# Patient Record
Sex: Male | Born: 1980 | Race: White | Hispanic: No | Marital: Married | Smoking: Current every day smoker
Health system: Southern US, Community
[De-identification: ages and names within clinical notes are randomized; demographics above are authoritative.]

## PROBLEM LIST (undated history)

## (undated) DIAGNOSIS — M549 Dorsalgia, unspecified: Secondary | ICD-10-CM

## (undated) HISTORY — PX: CYST EXCISION: SHX5701

---

## 2012-11-19 ENCOUNTER — Emergency Department (HOSPITAL_COMMUNITY)
Admission: EM | Admit: 2012-11-19 | Discharge: 2012-11-19 | Disposition: A | Discharge status: Home / Self Care | Payer: Medicaid Other | Attending: Emergency Medicine | Admitting: Emergency Medicine

## 2012-11-19 ENCOUNTER — Encounter (HOSPITAL_COMMUNITY): Payer: Self-pay | Admitting: *Deleted

## 2012-11-19 ENCOUNTER — Emergency Department (HOSPITAL_COMMUNITY): Payer: Medicaid Other

## 2012-11-19 DIAGNOSIS — S92919A Unspecified fracture of unspecified toe(s), initial encounter for closed fracture: Secondary | ICD-10-CM | POA: Insufficient documentation

## 2012-11-19 DIAGNOSIS — F17200 Nicotine dependence, unspecified, uncomplicated: Secondary | ICD-10-CM | POA: Insufficient documentation

## 2012-11-19 DIAGNOSIS — Y9389 Activity, other specified: Secondary | ICD-10-CM | POA: Insufficient documentation

## 2012-11-19 DIAGNOSIS — W208XXA Other cause of strike by thrown, projected or falling object, initial encounter: Secondary | ICD-10-CM | POA: Insufficient documentation

## 2012-11-19 DIAGNOSIS — S92911A Unspecified fracture of right toe(s), initial encounter for closed fracture: Secondary | ICD-10-CM

## 2012-11-19 DIAGNOSIS — Y929 Unspecified place or not applicable: Secondary | ICD-10-CM | POA: Insufficient documentation

## 2012-11-19 MED ORDER — MELOXICAM 7.5 MG PO TABS: ORAL_TABLET | ORAL | Status: DC

## 2012-11-19 MED ORDER — HYDROCODONE-ACETAMINOPHEN 5-325 MG PO TABS: ORAL_TABLET | ORAL | Status: DC

## 2012-11-19 NOTE — ED Provider Notes (Signed)
History     CSN: [id number]  Arrival date & time 11/19/12  4098   First MD Initiated Contact with Patient 11/19/12 0930      Chief Complaint  Patient presents with  . Toe Injury    (Consider location/radiation/quality/duration/timing/severity/associated sxs/prior treatment) HPI Comments: Pt was carrying wood when 3 moderate size pieces fell on the right 4th and 5th toes last evening. Pt has  Pain with applying weight. No hx of injury or surgery to the right foot.  The history is provided by the patient.    History reviewed. No pertinent past medical history.  Past Surgical History  Procedure Date  . Cyst excision     No family history on file.  History  Substance Use Topics  . Smoking status: Current Every Day Smoker    Types: Cigarettes  . Smokeless tobacco: Not on file  . Alcohol Use: Yes     Comment: Occ      Review of Systems  Constitutional: Negative for activity change.       All ROS Neg except as noted in HPI  HENT: Negative for nosebleeds and neck pain.   Eyes: Negative for photophobia and discharge.  Respiratory: Negative for cough, shortness of breath and wheezing.   Cardiovascular: Negative for chest pain and palpitations.  Gastrointestinal: Negative for abdominal pain and blood in stool.  Genitourinary: Negative for dysuria, frequency and hematuria.  Musculoskeletal: Negative for back pain and arthralgias.  Skin: Negative.   Neurological: Negative for dizziness, seizures and speech difficulty.  Psychiatric/Behavioral: Negative for hallucinations and confusion.    Allergies  Review of patient's allergies indicates no known allergies.  Home Medications  No current outpatient prescriptions on file.  BP 121/88  Pulse 84  Temp 97.8 F (36.6 C) (Oral)  Resp 20  Ht 5\' 9"  (1.753 m)  Wt 145 lb (65.772 kg)  BMI 21.41 kg/m2  SpO2 100%  Physical Exam  Nursing note and vitals reviewed. Constitutional: He is oriented to person, place, and time.  He appears well-developed and well-nourished.  Non-toxic appearance.  HENT:  Head: Normocephalic.  Right Ear: Tympanic membrane and external ear normal.  Left Ear: Tympanic membrane and external ear normal.  Eyes: EOM and lids are normal. Pupils are equal, round, and reactive to light.  Neck: Normal range of motion. Neck supple. Carotid bruit is not present.  Cardiovascular: Normal rate, regular rhythm, normal heart sounds, intact distal pulses and normal pulses.   Pulmonary/Chest: Breath sounds normal. No respiratory distress.  Abdominal: Soft. Bowel sounds are normal. There is no tenderness. There is no guarding.  Musculoskeletal: Normal range of motion.       There is swelling and bruising of the right 5th toe from the MP to the distal tip. Pain to palpation. DP 2+. Sensory on the right wnl. No bruise or deformity of the anterior tibial area.  Lymphadenopathy:       Head (right side): No submandibular adenopathy present.       Head (left side): No submandibular adenopathy present.    He has no cervical adenopathy.  Neurological: He is alert and oriented to person, place, and time. He has normal strength. No cranial nerve deficit or sensory deficit.  Skin: Skin is warm and dry.  Psychiatric: He has a normal mood and affect. His speech is normal.    ED Course  Procedures (including critical care time)  Labs Reviewed - No data to display Dg Foot Complete Right  11/19/2012  *RADIOLOGY REPORT*  Clinical Data: Injury to right fifth toe  RIGHT FOOT COMPLETE - 3+ VIEW  Comparison: None.  Findings: There is soft tissue swelling at the metatarsal phalangeal joint.  There is a minimally displaced fracture through the base of the fifth proximal phalanx without significant displacement.  No other focal abnormality is noted.  IMPRESSION: Fracture of the base of the fifth proximal phalanx.   Original Report Authenticated By: Alcide Clever, M.D.      No diagnosis found.    MDM  I have reviewed  nursing notes, vital signs, and all appropriate lab and imaging results for this patient. Xray of the right foot reveals a fx at the base of the right 5th proximal phalanx.  Buddy tape and post op shoe applied. Pt to see Dr Romeo Apple for evaluation. Rx for mobic and norco given to the patient.       Kathie Dike, [place] 11/19/12 1133

## 2012-11-19 NOTE — ED Notes (Signed)
Pt states he dropped a log on his foot at 0400 this morning. States bruising to the toe and pain on same side of foot.

## 2012-11-19 NOTE — ED Provider Notes (Signed)
Medical screening examination/treatment/procedure(s) were performed by non-physician practitioner and as supervising physician I was immediately available for consultation/collaboration.  Donnetta Hutching, MD 11/19/12 1246

## 2012-12-27 ENCOUNTER — Encounter (HOSPITAL_COMMUNITY): Payer: Self-pay

## 2012-12-27 ENCOUNTER — Emergency Department (HOSPITAL_COMMUNITY): Payer: Medicaid Other

## 2012-12-27 ENCOUNTER — Emergency Department (HOSPITAL_COMMUNITY)
Admission: EM | Admit: 2012-12-27 | Discharge: 2012-12-27 | Disposition: A | Discharge status: Home / Self Care | Payer: Medicaid Other | Attending: Emergency Medicine | Admitting: Emergency Medicine

## 2012-12-27 DIAGNOSIS — G8929 Other chronic pain: Secondary | ICD-10-CM | POA: Insufficient documentation

## 2012-12-27 DIAGNOSIS — F17200 Nicotine dependence, unspecified, uncomplicated: Secondary | ICD-10-CM | POA: Insufficient documentation

## 2012-12-27 DIAGNOSIS — M549 Dorsalgia, unspecified: Secondary | ICD-10-CM | POA: Insufficient documentation

## 2012-12-27 HISTORY — DX: Dorsalgia, unspecified: M54.9

## 2012-12-27 MED ORDER — OXYCODONE-ACETAMINOPHEN 5-325 MG PO TABS
2.0000 | ORAL_TABLET | Freq: Once | ORAL | Status: AC
  Administered 2012-12-27: 2 via ORAL
  Filled 2012-12-27: qty 2

## 2012-12-27 MED ORDER — OXYCODONE-ACETAMINOPHEN 5-325 MG PO TABS: 1.0000 | ORAL_TABLET | Freq: Four times a day (QID) | ORAL | Status: DC | PRN

## 2012-12-27 NOTE — ED Notes (Signed)
Back pain with history of the same. Really started bothering me over the past 3 days per pt.

## 2012-12-29 NOTE — ED Provider Notes (Signed)
History     CSN: [id number]  Arrival date & time 12/27/12  7829   First MD Initiated Contact with Patient 12/27/12 0421      Chief Complaint  Patient presents with  . Back Pain    (Consider location/radiation/quality/duration/timing/severity/associated sxs/prior treatment) HPIThomas Ford is a 31 y.o. male presenting with acute on chronic back pain. Denies fever, chills, weight loss, antecedent trauma, weakness, numbness, paresthesia, loss of bowel or bladder continence.  His pain is severe, lumbar spinal region, left lumbar PS muscle region is worse with radiation into the buttock. He has had this pain before. He has recently moved back into the area and is getting set up with pain management.  Past Medical History  Diagnosis Date  . Back pain     Past Surgical History  Procedure Date  . Cyst excision     History reviewed. No pertinent family history.  History  Substance Use Topics  . Smoking status: Current Every Day Smoker    Types: Cigarettes  . Smokeless tobacco: Not on file  . Alcohol Use: Yes     Comment: Occ      Review of Systems At least 10pt or greater review of systems completed and are negative except where specified in the HPI.  Allergies  Review of patient's allergies indicates no known allergies.  Home Medications   Current Outpatient Rx  Name  Route  Sig  Dispense  Refill  . HYDROCODONE-ACETAMINOPHEN 5-325 MG PO TABS      1 or 2 po q4h prn pain   20 tablet   0   . MELOXICAM 7.5 MG PO TABS      1 po bid with food   12 tablet   0   . OXYCODONE-ACETAMINOPHEN 5-325 MG PO TABS   Oral   Take 1-2 tablets by mouth every 6 (six) hours as needed for pain.   13 tablet   0     BP 133/85  Pulse 91  Temp 97.9 F (36.6 C) (Oral)  Ht 5\' 11"  (1.803 m)  Wt 140 lb (63.504 kg)  BMI 19.53 kg/m2  SpO2 100%  Physical Exam  Nursing notes reviewed.  Electronic medical record reviewed. VITAL SIGNS:   Filed Vitals:   12/27/12 0358  BP:  133/85  Pulse: 91  Temp: 97.9 F (36.6 C)  TempSrc: Oral  Height: 5\' 11"  (1.803 m)  Weight: 140 lb (63.504 kg)  SpO2: 100%   CONSTITUTIONAL: Awake, oriented, appears non-toxic HENT: Atraumatic, normocephalic, oral mucosa pink and moist, airway patent. Nares patent without drainage. External ears normal. EYES: Conjunctiva clear, EOMI, PERRLA NECK: Trachea midline, non-tender, supple CARDIOVASCULAR: Normal heart rate, Normal rhythm, No murmurs, rubs, gallops PULMONARY/CHEST: Clear to auscultation, no rhonchi, wheezes, or rales. Symmetrical breath sounds. Non-tender. ABDOMINAL: Non-distended, soft, non-tender - no rebound or guarding.  BS normal. NEUROLOGIC: Non-focal, moving all four extremities, no gross sensory or motor deficits. Reflexes 2+ bilateral patella and Achilles, no clonus. Pt can walk and stand unassisted. BACK: Back pain left lumbar region EXTREMITIES: No clubbing, cyanosis, or edema SKIN: Warm, Dry, No erythema, No rash  ED Course  Procedures (including critical care time)  Labs Reviewed - No data to display No results found. Dg Lumbar Spine Complete  12/27/2012  *RADIOLOGY REPORT*  Clinical Data: Lower back pain for 3 weeks.  LUMBAR SPINE - COMPLETE 4+ VIEW  Comparison: None.  Findings: There is no evidence of fracture or subluxation. Vertebral bodies demonstrate normal height and alignment. Intervertebral disc spaces  are preserved.  The visualized neural foramina are grossly unremarkable in appearance.  The visualized bowel gas pattern is unremarkable in appearance; air and stool are noted within the colon.  The sacroiliac joints are within normal limits.  IMPRESSION: No evidence of fracture or subluxation along the lumbar spine.   Original Report Authenticated By: Tonia Ghent, M.D.     1. Chronic back pain       MDM  Jorge Ford is a 31 y.o. male presents with acute on chronic back pain.  XR neg.  Treated pain and DC witthr Rx for pain medicine after review  of narcotic database.  I explained the diagnosis and have given explicit precautions to return to the ER including numbness, tingling, weakness, urinary retention or incontinence or any other new or worsening symptoms. The patient understands and accepts the medical plan as it's been dictated and I have answered their questions. Discharge instructions concerning home care and prescriptions have been given.  The patient is STABLE and is discharged to home in good condition.         Jones Skene, MD 12/29/12 2022

## 2013-01-15 ENCOUNTER — Encounter (HOSPITAL_COMMUNITY): Payer: Self-pay | Admitting: Emergency Medicine

## 2013-01-15 ENCOUNTER — Emergency Department (HOSPITAL_COMMUNITY)
Admission: EM | Admit: 2013-01-15 | Discharge: 2013-01-15 | Disposition: A | Discharge status: Home / Self Care | Payer: Medicaid Other | Attending: Emergency Medicine | Admitting: Emergency Medicine

## 2013-01-15 DIAGNOSIS — M546 Pain in thoracic spine: Secondary | ICD-10-CM | POA: Insufficient documentation

## 2013-01-15 DIAGNOSIS — F17200 Nicotine dependence, unspecified, uncomplicated: Secondary | ICD-10-CM | POA: Insufficient documentation

## 2013-01-15 DIAGNOSIS — M549 Dorsalgia, unspecified: Secondary | ICD-10-CM

## 2013-01-15 MED ORDER — MELOXICAM 7.5 MG PO TABS: 15.0000 mg | ORAL_TABLET | Freq: Every day | ORAL | Status: DC

## 2013-01-15 MED ORDER — OXYCODONE-ACETAMINOPHEN 5-325 MG PO TABS: 1.0000 | ORAL_TABLET | Freq: Four times a day (QID) | ORAL | Status: DC | PRN

## 2013-01-15 MED ORDER — OXYCODONE-ACETAMINOPHEN 5-325 MG PO TABS
2.0000 | ORAL_TABLET | Freq: Once | ORAL | Status: AC
  Administered 2013-01-15: 2 via ORAL
  Filled 2013-01-15: qty 2

## 2013-01-15 NOTE — ED Notes (Signed)
Patient states he has a pinched nerve in his lower back; states pain has been getting worse over the past 3 weeks.  States has an appointment with pain clinic on Tuesday.

## 2013-01-15 NOTE — ED Provider Notes (Signed)
History     CSN: [id number]  Arrival date & time 01/15/13  0229   First MD Initiated Contact with Patient 01/15/13 [phone]      Chief Complaint  Patient presents with  . Back Pain    (Consider location/radiation/quality/duration/timing/severity/associated sxs/prior treatment) HPI Comments: 32 year old male with a history of back pain for which she has been treated at the pain clinic in the past however he has not been in this area for greater than one year until recently and has reestablished with the family doctor who has referred him again to the pain clinic. He has an appointment on Tuesday. Currently he is having back pain which is located in the lower thoracic area, central, nonradiating, aching and severe from time to time. It is worse with movement, worse when he lays down, worse when he changes position. He denies any numbness or weakness to the legs, fevers chills nausea vomiting IV drug use, urinary retention or incontinence and no history of cancer. He denies any other red flags for pathologic back pain. He states this pain is similar to his chronic pain. He has been using aspirin for pain with minimal relief. He did visit the emergency department approximately one month ago and was treated with oxycodone and Mobic with some relief.  Patient is a 32 y.o. male presenting with back pain. The history is provided by the patient.  Back Pain  Pertinent negatives include no fever, no numbness and no weakness.    Past Medical History  Diagnosis Date  . Back pain     Past Surgical History  Procedure Date  . Cyst excision     No family history on file.  History  Substance Use Topics  . Smoking status: Current Every Day Smoker    Types: Cigarettes  . Smokeless tobacco: Not on file  . Alcohol Use: Yes     Comment: Occ      Review of Systems  Constitutional: Negative for fever and chills.  HENT: Negative for neck pain.   Cardiovascular: Negative for leg swelling.    Gastrointestinal: Negative for nausea and vomiting.       No incontinence of bowel  Genitourinary: Negative for difficulty urinating.       No incontinence or retention  Musculoskeletal: Positive for back pain.  Skin: Negative for rash.  Neurological: Negative for weakness and numbness.    Allergies  Review of patient's allergies indicates no known allergies.  Home Medications   Current Outpatient Rx  Name  Route  Sig  Dispense  Refill  . HYDROCODONE-ACETAMINOPHEN 5-325 MG PO TABS      1 or 2 po q4h prn pain   20 tablet   0   . MELOXICAM 7.5 MG PO TABS      1 po bid with food   12 tablet   0   . MELOXICAM 7.5 MG PO TABS   Oral   Take 2 tablets (15 mg total) by mouth daily.   30 tablet   0   . OXYCODONE-ACETAMINOPHEN 5-325 MG PO TABS   Oral   Take 1-2 tablets by mouth every 6 (six) hours as needed for pain.   15 tablet   0     BP 124/84  Pulse 79  Temp 98.6 F (37 C) (Oral)  Resp 20  Ht 5\' 11"  (1.803 m)  Wt 135 lb (61.236 kg)  BMI 18.83 kg/m2  SpO2 99%  Physical Exam  Nursing note and vitals reviewed. Constitutional: He appears  well-developed and well-nourished.  HENT:  Head: Normocephalic and atraumatic.  Eyes: Conjunctivae normal are normal. No scleral icterus.  Cardiovascular: Normal rate, regular rhythm and intact distal pulses.   Pulmonary/Chest: Effort normal and breath sounds normal.  Abdominal: Soft.       No pulsating masses, no guarding, no tenderness  Musculoskeletal: He exhibits tenderness ( Local tenderness to the lower thoracic spine and paraspinal muscles, no tenderness over the lumbar or sacral spines).       No spinal tenderness of the cervical, thoracic or lumbar spines  Neurological: He is alert.       Gait is antalgic secondary to low back pain, isolated strength of the bilateral lower extremities is normal, sensation normal, speech normal. Normal reflexes at the patellar tendons bilaterally  Skin: Skin is warm and dry. No  erythema.    ED Course  Procedures (including critical care time)  Labs Reviewed - No data to display No results found.   1. Back pain       MDM  Overall the patient appears well, there is no acute distress, he is able to give me a full history and has no pathologic sources of back pain on history and I do not detect any focal neurologic deficits and he will be given pain medication in the emergency department and referred as an outpatient to the chronic pain clinic which she has an appointment at in 5 days.        Vida Roller, MD 01/15/13 [phone]

## 2013-01-15 NOTE — ED Notes (Signed)
Discharge instructions given and reviewed with patient.  Prescriptions given for Mobic and Percocet; effects and use explained for each.  Patient verbalized understanding of sedating effects of Percocet and to take Mobic as directed.  My Chart was explained to patient for access to medical record.  Patient verbalized understanding to follow up with Dr. Olena Leatherwood for continued pain management until he can get to scheduled appointment with pain clinic.  Patient ambulatory; discharged home in good condition.  Significant other accompanied discharge to drive home.

## 2013-03-16 ENCOUNTER — Emergency Department (HOSPITAL_COMMUNITY)
Admission: EM | Admit: 2013-03-16 | Discharge: 2013-03-17 | Disposition: A | Discharge status: Home / Self Care | Payer: Medicaid Other | Attending: Emergency Medicine | Admitting: Emergency Medicine

## 2013-03-16 ENCOUNTER — Encounter (HOSPITAL_COMMUNITY): Payer: Self-pay | Admitting: *Deleted

## 2013-03-16 DIAGNOSIS — M546 Pain in thoracic spine: Secondary | ICD-10-CM | POA: Insufficient documentation

## 2013-03-16 DIAGNOSIS — F17200 Nicotine dependence, unspecified, uncomplicated: Secondary | ICD-10-CM | POA: Insufficient documentation

## 2013-03-16 DIAGNOSIS — M549 Dorsalgia, unspecified: Secondary | ICD-10-CM

## 2013-03-16 DIAGNOSIS — G8929 Other chronic pain: Secondary | ICD-10-CM | POA: Insufficient documentation

## 2013-03-16 MED ORDER — OXYCODONE-ACETAMINOPHEN 5-325 MG PO TABS
2.0000 | ORAL_TABLET | Freq: Once | ORAL | Status: AC
  Administered 2013-03-16: 2 via ORAL
  Filled 2013-03-16: qty 2

## 2013-03-16 MED ORDER — CYCLOBENZAPRINE HCL 10 MG PO TABS: 10.0000 mg | ORAL_TABLET | Freq: Three times a day (TID) | ORAL | Status: DC | PRN

## 2013-03-16 MED ORDER — CYCLOBENZAPRINE HCL 10 MG PO TABS
10.0000 mg | ORAL_TABLET | Freq: Once | ORAL | Status: AC
  Administered 2013-03-16: 10 mg via ORAL
  Filled 2013-03-16: qty 1

## 2013-03-16 NOTE — ED Provider Notes (Signed)
Medical screening examination/treatment/procedure(s) were performed by non-physician practitioner and as supervising physician I was immediately available for consultation/collaboration.   Shelda Jakes, MD 03/16/13 [phone]

## 2013-03-16 NOTE — ED Provider Notes (Signed)
History     CSN: [id number]  Arrival date & time 03/16/13  2208   First MD Initiated Contact with Patient 03/16/13 2231      Chief Complaint  Patient presents with  . Back Pain    (Consider location/radiation/quality/duration/timing/severity/associated sxs/prior treatment) HPI Comments: Patient with hx of chronic lower thoracic back pain c/o worsening pain for 3 weeks.  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  He states that he is currently being treated at a pain management clinic and has an appt on Friday 03/20/13.  He take hydrocodone 5/325 mg 4 times a day w/o relief of the pain.  Pain is worse with twisting and bending and improves with rest.  He denies numbness or weakness of the extremities, abd pain, dysuria, incontinence or bowel or bladder, chest pain, dyspnea or neck pain.    Patient is a 32 y.o. male presenting with back pain. The history is provided by the patient.  Back Pain Location:  Thoracic spine Quality:  Aching Radiates to:  Does not radiate Pain severity:  Moderate Onset quality:  Gradual Timing:  Intermittent Progression:  Waxing and waning Chronicity:  Chronic Context: lifting heavy objects and twisting   Context: not recent illness   Relieved by:  Bed rest Worsened by:  Movement, twisting and bending Ineffective treatments:  Narcotics Associated symptoms: no abdominal pain, no abdominal swelling, no bladder incontinence, no bowel incontinence, no dysuria, no fever, no headaches, no leg pain, no numbness, no paresthesias, no pelvic pain, no perianal numbness, no tingling and no weakness     Past Medical History  Diagnosis Date  . Back pain     Past Surgical History  Procedure Laterality Date  . Cyst excision      History reviewed. No pertinent family history.  History  Substance Use Topics  . Smoking status: Current Every Day Smoker    Types: Cigarettes  . Smokeless  tobacco: Not on file  . Alcohol Use: Yes     Comment: Occ      Review of Systems  Constitutional: Negative for fever.  Gastrointestinal: Negative for abdominal pain and bowel incontinence.  Genitourinary: Negative for bladder incontinence, dysuria and pelvic pain.  Musculoskeletal: Positive for back pain.  Neurological: Negative for tingling, weakness, numbness, headaches and paresthesias.  All other systems reviewed and are negative.    Allergies  Review of patient's allergies indicates no known allergies.  Home Medications   Current Outpatient Rx  Name  Route  Sig  Dispense  Refill  . HYDROcodone-acetaminophen (NORCO/VICODIN) 5-325 MG per tablet   Oral   Take 1 tablet by mouth every 4 (four) hours as needed for pain. 1 or 2 po q4h prn pain           BP 133/71  Pulse 88  Temp(Src) 98.2 F (36.8 C) (Oral)  Resp 18  Ht 5\' 11"  (1.803 m)  Wt 145 lb (65.772 kg)  BMI 20.23 kg/m2  SpO2 100%  Physical Exam  Nursing note and vitals reviewed. Constitutional: He is oriented to person, place, and time. He appears well-developed and well-nourished. No distress.  HENT:  Head: Normocephalic and atraumatic.  Neck: Normal range of motion. Neck supple.  Cardiovascular: Normal rate, regular rhythm, normal heart sounds and intact distal pulses.   No murmur heard. Pulmonary/Chest: Effort normal and breath sounds normal. No respiratory distress. He exhibits no tenderness.  Abdominal: Soft. He exhibits no distension.  There is no tenderness.  Musculoskeletal: He exhibits tenderness. He exhibits no edema.       Lumbar back: He exhibits tenderness and pain. He exhibits normal range of motion, no swelling, no deformity, no laceration and normal pulse.       Back:  ttp of the lower thoracic spine and paraspinal muscles.  No discoloration, no neuro deficits.  Distal sensation intact  Neurological: He is alert and oriented to person, place, and time. No cranial nerve deficit or sensory  deficit. He exhibits normal muscle tone. Coordination and gait normal.  Reflex Scores:      Tricep reflexes are 2+ on the right side and 2+ on the left side.      Bicep reflexes are 2+ on the right side and 2+ on the left side.      Patellar reflexes are 2+ on the right side and 2+ on the left side.      Achilles reflexes are 2+ on the right side and 2+ on the left side. Skin: Skin is warm and dry.    ED Course  Procedures (including critical care time)  Labs Reviewed - No data to display No results found.      MDM  Previous ED charts reviewed by me.    Vitals are stable, pt is well appearing.    Patient has ttp of the lower thoracic spine and paraspinal muscles.  Hx of same.  No focal neuro deficits on exam.  Has full ROM of both upper and lower extremities.  SLR is negative bilaterally.  Ambulates with a steady gait.   Doubt emergent neurological or infectious process.   He has appt with his pain management clinic this week.  I will give flexeril and percocet here with prescription for flexeril only. Will also write a work restriction for him with the change in his usual work duties being the likely cause of his increased pain.     The patient appears reasonably screened and/or stabilized for discharge and I doubt any other medical condition or other [facility] requiring further screening, evaluation, or treatment in the ED at this time prior to discharge.      Emberlynn Riggan L. Trisha Mangle, PA-C 03/16/13 2327

## 2013-03-16 NOTE — ED Notes (Signed)
Chronic back pain, worse for 3 weeks.

## 2013-03-19 ENCOUNTER — Ambulatory Visit (HOSPITAL_COMMUNITY)
Admission: RE | Admit: 2013-03-19 | Discharge: 2013-03-19 | Disposition: A | Discharge status: Home / Self Care | Payer: Medicaid Other | Source: Ambulatory Visit | Attending: Anesthesiology | Admitting: Anesthesiology

## 2013-03-19 DIAGNOSIS — M545 Low back pain, unspecified: Secondary | ICD-10-CM | POA: Insufficient documentation

## 2013-03-19 DIAGNOSIS — M6281 Muscle weakness (generalized): Secondary | ICD-10-CM | POA: Insufficient documentation

## 2013-03-19 DIAGNOSIS — IMO0001 Reserved for inherently not codable concepts without codable children: Secondary | ICD-10-CM | POA: Insufficient documentation

## 2013-03-19 NOTE — Evaluation (Signed)
Physical Therapy Evaluation/Medicaid  Patient Details  Name: Jorge Ford MRN: [id number] Date of Birth: 1981-11-13  Today's Date: 03/19/2013 Time: 5956-3875 PT Time Calculation (min): 41 min Charges: 1 eval              Visit#: 1 of 4  Re-eval: 04/18/13 Assessment Diagnosis: LBP Next MD Visit: Dr. Nilsa Nutting - tomorrow  Authorization: MEDICAID    Authorization Time Period:    Authorization Visit#: 1 of 4   Past Medical History:  Past Medical History  Diagnosis Date  . Back pain    Past Surgical History:  Past Surgical History  Procedure Laterality Date  . Cyst excision     Subjective Symptoms/Limitations Symptoms: PMH: unremarkable. Pertinent History: Pt is referred to PT for LBP which he is unsure when it started.  He currently is working for [employer] and requires a lot of grinding and lifting.  Has been working for 6 months, 10 hour shifts. second shift food prep at [employer], Denies hx of LBP  Limitations: Lifting How long can you sit comfortably?: couple hours How long can you stand comfortably?: after a few hours starts to hurt (noticed at job as food prep) How long can you walk comfortably?: 8 hours.  Patient Stated Goals: Pt wishes to feel better.  Pain Assessment Currently in Pain?: Yes Pain Score:   5 Pain Location: Back Pain Orientation: Mid;Lower Pain Type: Acute pain;Chronic pain Pain Onset: Other (comment) Pain Frequency: Constant Pain Relieving Factors: hydrocodone, ice and heat Effect of Pain on Daily Activities: difficulty with his job requirments (lifting), pain with transitional movements.   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   Cognition/Observation Observation/Other Assessments Observations: impaired lifting mechanics, bends knees, back bent and keeps object far from body  Sensation/Coordination/Flexibility/Functional Tests Coordination Gross Motor Movements are Fluid and Coordinated: Yes Coordination and Movement Description: independent coordination of transverse abdonimus, pelvic floor and multifidus with weakness with lt multifidus noted   Assessment RLE Assessment RLE Assessment: Within Functional Limits LLE Assessment LLE Assessment: Within Functional Limits Lumbar AROM Lumbar Flexion: WNL - pain and gower sign on return Lumbar Extension: decreased 50% Lumbar - Right Side Bend: WNL Lumbar - Left Side Bend: WNL Palpation Palpation: pin point tenderness to L1 spinous process with mild pain and tenderness to lumbar and thoracic erector spinae.  Mobility/Balance  Ambulation/Gait Ambulation/Gait: Yes Gait Pattern: Antalgic;Lateral hip instability;Lateral trunk lean to left;Lateral trunk lean to right Posture/Postural Control Posture/Postural Control: Postural limitations Postural Limitations: slouched   Exercise/Treatments Supine Ab Set: 5 reps;Limitations AB Set Limitations: 10 sec holds Prone  Other Prone Lumbar Exercises: Pelvic Floor 5x10 sec holds Other Prone Lumbar Exercises: multifidus 5x10 sec holds  Physical Therapy Assessment and Plan PT Assessment and Plan Clinical Impression Statement: Pt is a 32 year old male referred to PT for LBP with following impairments listed below.  At this time he is independent with core coordination movements and demonstrates greatest weakness with lt multiifidus activation and pin point tenderness to L1 spinous process.  Reviewed x-rays from 12/27/12 without report of significant injuiry.  Pt will benefit from skilled therapeutic intervention in order to improve on the following deficits: Pain;Decreased  strength;Improper  body mechanics Rehab Potential: Fair Clinical Impairments Affecting Rehab Potential: secondary to insurance limitations PT Frequency: Min 1X/week PT Duration:  (3) PT Treatment/Interventions: Therapeutic exercise;Therapeutic activities;Neuromuscular re-education;Patient/family education;Manual techniques;Modalities PT Plan: Core strengthening (bent knee raise, clams, SLR), continue to progress towards pilates techniques, pt to be independent with approrpriate lifiting techniques.     Goals Home Exercise Program Pt will Perform Home Exercise Program: Independently PT Goal: Perform Home Exercise Program - Progress: Goal set today PT Short Term Goals Time to Complete Short Term Goals: 3 weeks (3 visits) PT Short Term Goal 1: Pt will report pain less than a 4/10 for 75% of his day.  PT Short Term Goal 2: Pt will demonstrate normal back AROM without pain and end range.  PT Short Term Goal 3: Pt will improve core strength in order to go from transitional movements with reports of decreased pain and without demonstration of gower sign.  PT Short Term Goal 4: Pt will be educated in a proper lifting program in order to decrease risk of secondary impairments.   Problem List Patient Active Problem List  Diagnosis  . Low back pain   PT Plan of Care PT Home Exercise Plan: see scanne report (core exercises) PT Patient Instructions: education, demonstration and independent verbalization of approprriate posture.  Consulted and Agree with Plan of Care: Patient  Annett Fabian, PT 03/19/2013, 2:43 PM  INITIAL EVALUATION  Physical Therapy     Patient Name: Jorge Ford Date Of Birth: 1981/08/29  Guardian Name: N/A Treatment ICD-9 Code: 7242  Address: [address] Date of Evaluation: 03/19/2013  [place], [place] [address] Requested Dates of Service: 03/20/2013 - 04/10/2013       Therapy History: No known therapy for this problem  Reason For Referral: Recipient has an exacerbation of an  old injury, disease or condition  Prior Level of Function: Independent/Modified Independent with all ADLs (OT/PT) or Audition, Communication, Voice and/or Swallowing Skills (ST/AUD)  Additional Medical History: Pertinent History: Pt is referred to PT for LBP which he is unsure when it started. He currently is working for [employer] and requires a lot of grinding and lifting. Has been working for 6 months, 10 hour shifts. 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 Patient Stated Goals: Pt wishes to feel better. Pain Assessment Currently in Pain?: Yes   Prematurity: N/A  Severity Level: N/A       Treatment Goals:  1. Goal: Physical Therapy Evaluation/Medicaid Patient Details Name: Jorge Ford MRN: [id number] Date of Birth: September 07, 1981 Today's Date: 03/19/2013 Time: 8413-2440 PT Time Calculation (min): 41 min Charges: 1 eval Visit#: 1 of 4 Re-eval: 04/18/13 Assessment Diagnosis: LBP Next MD Visit: Dr. Nilsa Nutting - tomorrow Authorization: MEDICAID Authorization Time Period: Authorization Visit#: 1 of 4 Past Medical History: Past Medical History Diagnosis Date . Back pain Past Surgical History: Past Surgical History Procedure Laterality Date . Cyst excision Subjective Symptoms/Limitations Symptoms: PMH: unremarkable. Pertinent History: Pt is referred to PT for LBP which he is unsure when it started. He currently is working for [employer] and requires a lot of grindin  Baseline: given  Duration: 3 Week(s)  2. Goal: Pt will report pain less than a 4/10 for 75% of his day.  Baseline: Pain Score: 5 Pain Location: Back  Pain Orientation: Mid;Lower Pain Type: Acute pain;Chronic pain Pain Onset: Other (comment) Pain Frequency: Constant Pain Relieving Factors: hydrocodone, ice and  heat Effect of Pain on Daily Activities: difficulty with his job requirments (lifting), pain with transitional movements.  Duration: 3 Week(s)  3. Goal: Pt will demonstrate normal back AROM without pain and end range.  Baseline: RLE Assessment RLE Assessment: Within Functional Limits LLE Assessment LLE Assessment: Within Functional Limits Lumbar AROM Lumbar Flexion: WNL - pain and gower sign on return Lumbar Extension: decreased 50% Lumbar - Right Side Bend: WNL Lumbar - Left Side Bend: WNL  Duration: 3 Week(s)  4. Goal: Pt will improve core strength in order to go from transitional movements with reports of decreased pain and without demonstration of gower sign.  Baseline: Lumbar Flexion: WNL - pain and gower sign on return Coordination and Movement Description: independent coordination of transverse abdonimus, pelvic floor and multifidus with weakness with lt multifidus noted  Duration: 3 Week(s)  Goal: Pt will be educated in a proper lifting program in order to decrease risk of secondary impairments.  Baseline: impaired body mechanics. ] Observations: impaired lifting mechanics, bends knees, back bent and keeps object far from body  Duration: 3 Week(s)         Treatment Frequency/Duration:  1x/week for 3 weeks  Units per visit: N/A    Additional Information: Clinical Impression Statement: Pt is a 32 year old male referred to PT for LBP with following impairments listed below. At this time he is independent with core coordination movements and demonstrates greatest weakness with lt multiifidus activation and pin point tenderness to L1 spinous process. Reviewed x-rays from 12/27/12 without report of significant injuiry. Pt will benefit from skilled therapeutic intervention in order to improve on the following deficits: Pain;Decreased strength;Improper body mechanics Rehab Potential: Fair Clinical Impairments Affecting Rehab Potential: secondary to insurance limitations PT Frequency: Min 1X/week PT  Duration: (3) PT Treatment/Interventions: Therapeutic exercise;Therapeutic activities;Neuromuscular re-education;Patient/family education;Manual techniques;Modalities PT Plan: Core strengthening (bent knee raise, clams, SLR), continue to progress towards pilates techniques, pt to be independent with approrpriate lifiting techniques           Therapist Signature  Date Physician Signature  Date    Annett Fabian       Therapist Name  Physician Name   Refer to the Review Status page for current case status

## 2013-03-26 ENCOUNTER — Telehealth (HOSPITAL_COMMUNITY): Payer: Self-pay

## 2013-03-26 ENCOUNTER — Ambulatory Visit (HOSPITAL_COMMUNITY): Payer: Medicaid Other | Admitting: Physical Therapy

## 2013-04-02 ENCOUNTER — Ambulatory Visit (HOSPITAL_COMMUNITY)
Admission: RE | Admit: 2013-04-02 | Discharge: 2013-04-02 | Disposition: A | Discharge status: Home / Self Care | Payer: Medicaid Other | Source: Ambulatory Visit | Attending: Anesthesiology | Admitting: Anesthesiology

## 2013-04-02 DIAGNOSIS — M545 Low back pain, unspecified: Secondary | ICD-10-CM | POA: Insufficient documentation

## 2013-04-02 DIAGNOSIS — M6281 Muscle weakness (generalized): Secondary | ICD-10-CM | POA: Insufficient documentation

## 2013-04-02 DIAGNOSIS — IMO0001 Reserved for inherently not codable concepts without codable children: Secondary | ICD-10-CM | POA: Insufficient documentation

## 2013-04-02 NOTE — Progress Notes (Signed)
Physical Therapy Treatment Patient Details  Name: Jorge Ford MRN: [id number] Date of Birth: 1981-08-04  Today's Date: 04/02/2013 Time: 0932-1002 PT Time Calculation (min): 30 min Charges: 18' TE Visit#: 2 of 4  Re-eval: 04/18/13   Authorization: MEDICAID  Authorization Time Period: 3 visits approved 3/25-4/15  Authorization Visit#: 2 of 4   Subjective: Symptoms/Limitations Pertinent History: Pt reports that he is working on his posture and on the exercises.  Continues to have pain.  Pain Assessment Currently in Pain?: Yes Pain Score:   5 Pain Location: Back  Exercise/Treatments Standing Scapular Retraction: Both;10 reps;Theraband Theraband Level (Scapular Retraction): Level 4 (Blue) Row: Both;10 reps;Theraband Theraband Level (Row): Level 4 (Blue) Shoulder Extension: Both;10 reps;Theraband Theraband Level (Shoulder Extension): Level 4 (Blue) Shoulder ADduction: Both;10 reps;Theraband Theraband Level (Shoulder Adduction): Level 4 (Blue) Supine Clam: 10 reps;Limitations Clam Limitations: BLE w/ab set Bent Knee Raise: 10 reps;Limitations Bent Knee Raise Limitations: BLE w/ab set Other Supine Lumbar Exercises: Pilates 100's Other Supine Lumbar Exercises: Reverse crunch x10; Obliques x10 Prone  Single Arm Raise: Right;Left;5 reps Straight Leg Raise: 5 reps (BLE) Other Prone Lumbar Exercises: Pelvic Floor 8x10 sec holds Other Prone Lumbar Exercises: Back extension with lat pull down 2x5  Physical Therapy Assessment and Plan PT Assessment and Plan Clinical Impression Statement: Pt has improved coordination to core muscles, continues to have decreased strength and inability to hold 10 sec independently and requires cueing to hold.  Added advanced core activities for pt to continue at home.  Pt will benefit from skilled therapeutic intervention in order to improve on the following deficits: Pain;Decreased strength;Improper body mechanics Rehab Potential: Fair Clinical  Impairments Affecting Rehab Potential: secondary to insurance limitations PT Frequency: Min 1X/week PT Duration:  (3) PT Treatment/Interventions: Therapeutic exercise;Therapeutic activities;Neuromuscular re-education;Patient/family education;Manual techniques;Modalities PT Plan: Continue with pilates activities and core strengthening.  Focus on proper lifiting techniques next visit.     Goals    Problem List Patient Active Problem List  Diagnosis  . Low back pain    PT Plan of Care PT Home Exercise Plan: provided with blue t-band and advanced HEP Consulted and Agree with Plan of Care: Patient  Cally Nygard,PT 04/02/2013, 10:04 AM

## 2013-04-09 ENCOUNTER — Inpatient Hospital Stay (HOSPITAL_COMMUNITY): Admission: RE | Admit: 2013-04-09 | Payer: Medicaid Other | Source: Ambulatory Visit

## 2013-04-16 ENCOUNTER — Inpatient Hospital Stay (HOSPITAL_COMMUNITY): Admission: RE | Admit: 2013-04-16 | Payer: Medicaid Other | Source: Ambulatory Visit | Admitting: Physical Therapy

## 2013-10-16 ENCOUNTER — Encounter (HOSPITAL_COMMUNITY): Payer: Self-pay | Admitting: Emergency Medicine

## 2013-10-16 ENCOUNTER — Emergency Department (HOSPITAL_COMMUNITY)
Admission: EM | Admit: 2013-10-16 | Discharge: 2013-10-16 | Disposition: A | Discharge status: Home / Self Care | Payer: Medicaid Other | Attending: Emergency Medicine | Admitting: Emergency Medicine

## 2013-10-16 DIAGNOSIS — Y9389 Activity, other specified: Secondary | ICD-10-CM | POA: Insufficient documentation

## 2013-10-16 DIAGNOSIS — Z87891 Personal history of nicotine dependence: Secondary | ICD-10-CM | POA: Insufficient documentation

## 2013-10-16 DIAGNOSIS — X503XXA Overexertion from repetitive movements, initial encounter: Secondary | ICD-10-CM | POA: Insufficient documentation

## 2013-10-16 DIAGNOSIS — M545 Low back pain: Secondary | ICD-10-CM

## 2013-10-16 DIAGNOSIS — IMO0002 Reserved for concepts with insufficient information to code with codable children: Secondary | ICD-10-CM | POA: Insufficient documentation

## 2013-10-16 DIAGNOSIS — Y929 Unspecified place or not applicable: Secondary | ICD-10-CM | POA: Insufficient documentation

## 2013-10-16 MED ORDER — DICLOFENAC SODIUM 75 MG PO TBEC: 75.0000 mg | DELAYED_RELEASE_TABLET | Freq: Two times a day (BID) | ORAL | Status: DC

## 2013-10-16 MED ORDER — CYCLOBENZAPRINE HCL 10 MG PO TABS: 10.0000 mg | ORAL_TABLET | Freq: Three times a day (TID) | ORAL | Status: DC | PRN

## 2013-10-16 NOTE — ED Provider Notes (Signed)
CSN: [id number]     Arrival date & time 10/16/13  1011 History   First MD Initiated Contact with Patient 10/16/13 1015     Chief Complaint  Patient presents with  . Back Pain   (Consider location/radiation/quality/duration/timing/severity/associated sxs/prior Treatment) Patient is a 32 y.o. male presenting with back pain. The history is provided by the patient.  Back Pain Location:  Lumbar spine Quality:  Aching Radiates to:  Does not radiate Pain severity:  Moderate Pain is:  Same all the time Onset quality:  Gradual Duration:  3 days Timing:  Constant Progression:  Worsening Chronicity:  Recurrent Context: lifting heavy objects and twisting   Context comment:  Patient reports onset of lower back pain after "working in the yard"/  Has hx of same. Relieved by:  Nothing Worsened by:  Bending, movement, standing, twisting and sitting Ineffective treatments:  OTC medications Associated symptoms: no abdominal pain, no abdominal swelling, no bladder incontinence, no bowel incontinence, no chest pain, no dysuria, no fever, no headaches, no leg pain, no numbness, no paresthesias, no pelvic pain, no perianal numbness, no tingling and no weakness     Past Medical History  Diagnosis Date  . Back pain    Past Surgical History  Procedure Laterality Date  . Cyst excision     No family history on file. History  Substance Use Topics  . Smoking status: Former Smoker    Types: Cigarettes  . Smokeless tobacco: Not on file  . Alcohol Use: Yes     Comment: Occ    Review of Systems  Constitutional: Negative for fever.  Respiratory: Negative for shortness of breath.   Cardiovascular: Negative for chest pain.  Gastrointestinal: Negative for vomiting, abdominal pain, constipation and bowel incontinence.  Genitourinary: Negative for bladder incontinence, dysuria, hematuria, flank pain, decreased urine volume, difficulty urinating and pelvic pain.       No perineal numbness or incontinence  of urine or feces  Musculoskeletal: Positive for back pain. Negative for joint swelling.  Skin: Negative for rash.  Neurological: Negative for tingling, weakness, numbness, headaches and paresthesias.  All other systems reviewed and are negative.    Allergies  Review of patient's allergies indicates no known allergies.  Home Medications   Current Outpatient Rx  Name  Route  Sig  Dispense  Refill  . acetaminophen (TYLENOL) 500 MG tablet   Oral   Take 500 mg by mouth every 6 (six) hours as needed for pain.         Marland Kitchen ibuprofen (ADVIL,MOTRIN) 200 MG tablet   Oral   Take 200 mg by mouth every 6 (six) hours as needed for pain.          BP 125/81  Pulse 81  Temp(Src) 97.9 F (36.6 C) (Oral)  Resp 20  Ht 6' (1.829 m)  Wt 145 lb (65.772 kg)  BMI 19.66 kg/m2  SpO2 97% Physical Exam  Nursing note and vitals reviewed. Constitutional: He is oriented to person, place, and time. He appears well-developed and well-nourished. No distress.  HENT:  Head: Normocephalic and atraumatic.  Neck: Normal range of motion. Neck supple.  Cardiovascular: Normal rate, regular rhythm, normal heart sounds and intact distal pulses.   No murmur heard. Pulmonary/Chest: Effort normal and breath sounds normal. No respiratory distress.  Abdominal: Soft. He exhibits no distension. There is no tenderness.  Musculoskeletal: He exhibits tenderness. He exhibits no edema.       Lumbar back: He exhibits tenderness and pain. He exhibits normal range  of motion, no swelling, no deformity, no laceration and normal pulse.       Back:  Diffuse ttp of the lumbar spine and paraspinal muscles.    DP pulses are brisk and symmetrical.  Distal sensation intact.  Hip Flexors/Extensors are intact  Neurological: He is alert and oriented to person, place, and time. He has normal strength. No sensory deficit. He exhibits normal muscle tone. Coordination and gait normal.  Reflex Scores:      Patellar reflexes are 2+ on the  right side and 2+ on the left side.      Achilles reflexes are 2+ on the right side and 2+ on the left side. Skin: Skin is warm and dry. No rash noted.    ED Course  Procedures (including critical care time) Labs Review Labs Reviewed - No data to display Imaging Review No results found.  EKG Interpretation   None       MDM    Patient reviewed on the  narcotic database.  Received #30 hydrocodone 10/325mg  on 10/12/13 but only reported taking tylenol and motrin to pharmacy tech.    No focal neuro deficits on exam.  Ambulates with a steady gait.   No concerning sx's for emergent neurological or infectious process.  I have explained to pt that narcotic pain medication is not indicated at this time given this is a reoccurring problem.  Pt has appt with a PMD next week.    Pt appears stable for discharge.  Meredith Kilbride L. Trisha Mangle, PA-C 10/17/13 1744

## 2013-10-16 NOTE — ED Notes (Signed)
Lower back pain x 3 days, onset after doing yard work. Hx of "back problems" x 6 years.

## 2013-10-16 NOTE — ED Notes (Signed)
Lower back pain x 3 days after doing yard work. Hx of same. Was in pain management clinic, but [place] of [place] and has not been reestablished in clinic. Pain 9/10. No incontinence, numbness or radiation down legs.

## 2013-10-19 NOTE — ED Provider Notes (Signed)
Medical screening examination/treatment/procedure(s) were performed by non-physician practitioner and as supervising physician I was immediately available for consultation/collaboration.   Joya Gaskins, MD 10/19/13 2224

## 2014-05-14 ENCOUNTER — Emergency Department (HOSPITAL_COMMUNITY): Payer: Medicaid Other

## 2014-05-14 ENCOUNTER — Emergency Department (HOSPITAL_COMMUNITY)
Admission: EM | Admit: 2014-05-14 | Discharge: 2014-05-14 | Disposition: A | Discharge status: Home / Self Care | Payer: Medicaid Other | Attending: Emergency Medicine | Admitting: Emergency Medicine

## 2014-05-14 ENCOUNTER — Encounter (HOSPITAL_COMMUNITY): Payer: Self-pay | Admitting: Emergency Medicine

## 2014-05-14 DIAGNOSIS — Y929 Unspecified place or not applicable: Secondary | ICD-10-CM | POA: Insufficient documentation

## 2014-05-14 DIAGNOSIS — W19XXXA Unspecified fall, initial encounter: Secondary | ICD-10-CM

## 2014-05-14 DIAGNOSIS — Y9389 Activity, other specified: Secondary | ICD-10-CM | POA: Insufficient documentation

## 2014-05-14 DIAGNOSIS — Z79899 Other long term (current) drug therapy: Secondary | ICD-10-CM | POA: Insufficient documentation

## 2014-05-14 DIAGNOSIS — W11XXXA Fall on and from ladder, initial encounter: Secondary | ICD-10-CM | POA: Insufficient documentation

## 2014-05-14 DIAGNOSIS — IMO0002 Reserved for concepts with insufficient information to code with codable children: Secondary | ICD-10-CM | POA: Insufficient documentation

## 2014-05-14 DIAGNOSIS — S3992XA Unspecified injury of lower back, initial encounter: Secondary | ICD-10-CM

## 2014-05-14 DIAGNOSIS — F17200 Nicotine dependence, unspecified, uncomplicated: Secondary | ICD-10-CM | POA: Insufficient documentation

## 2014-05-14 LAB — URINALYSIS, ROUTINE W REFLEX MICROSCOPIC
Bilirubin Urine: NEGATIVE
GLUCOSE, UA: NEGATIVE mg/dL
HGB URINE DIPSTICK: NEGATIVE
KETONES UR: NEGATIVE mg/dL
Leukocytes, UA: NEGATIVE
Nitrite: NEGATIVE
PROTEIN: NEGATIVE mg/dL
Specific Gravity, Urine: >1.03 — ABNORMAL HIGH (ref 1.005–1.030)
Urobilinogen, UA: 1 mg/dL (ref 0.0–1.0)
pH: 5.5 (ref 5.0–8.0)

## 2014-05-14 MED ORDER — IBUPROFEN 800 MG PO TABS: 800.0000 mg | ORAL_TABLET | Freq: Three times a day (TID) | ORAL | Status: DC

## 2014-05-14 MED ORDER — IBUPROFEN 800 MG PO TABS
800.0000 mg | ORAL_TABLET | Freq: Once | ORAL | Status: AC
  Administered 2014-05-14: 800 mg via ORAL
  Filled 2014-05-14: qty 1

## 2014-05-14 MED ORDER — HYDROCODONE-ACETAMINOPHEN 5-325 MG PO TABS: 2.0000 | ORAL_TABLET | ORAL | Status: DC | PRN

## 2014-05-14 MED ORDER — HYDROCODONE-ACETAMINOPHEN 5-325 MG PO TABS
2.0000 | ORAL_TABLET | Freq: Once | ORAL | Status: AC
  Administered 2014-05-14: 2 via ORAL
  Filled 2014-05-14: qty 2

## 2014-05-14 NOTE — ED Notes (Signed)
Pt reports fell approx 7-8 ft from ladder this morning, hit head, denies LOC.  C/o pain to mid back.  Denies neck pain .

## 2014-05-14 NOTE — ED Provider Notes (Signed)
CSN: [id number]     Arrival date & time 05/14/14  1600 History   First MD Initiated Contact with Patient 05/14/14 1651    This chart was scribed for Glynn Octave, MD by Marica Otter, ED Scribe. This patient was seen in room [facility]10/[facility]10 and the patient's care was started at 4:57 PM.  Chief Complaint  Patient presents with  . Fall   PCP: Toma Deiters, MD  The history is provided by the patient. No language interpreter was used.   HPI Comments: Jorge Ford is a 33 y.o. male, with a Hx of back pain and cyst excision, who presents to the Emergency Department complaining of a 7-8 ft fall from a ladder this morning at approximately 9:30AM. Pt denies LOC or hitting his head. Pt states that he was able to get up and walk on his own after the fall. Pt complains of associated mid back pain. Pt reports he took over the counter pain meds such as Aleve with minor relief. Pt denies chest pain, HA, dysuria, neck pain, leg pain, loss of bladder control, loss of bowel movements, SOB, testicular pain or abd pain. Pt denies any allergies to meds; and reports that he is currently on Fluoxetine and Olanzapine. Pt is a current everyday smoker.   Past Medical History  Diagnosis Date  . Back pain    Past Surgical History  Procedure Laterality Date  . Cyst excision     No family history on file. History  Substance Use Topics  . Smoking status: Current Every Day Smoker    Types: Cigarettes  . Smokeless tobacco: Not on file  . Alcohol Use: Yes     Comment: Occ    Review of Systems  Respiratory: Negative for shortness of breath.   Cardiovascular: Negative for chest pain.  Gastrointestinal: Negative for abdominal pain.  Genitourinary: Negative for dysuria, decreased urine volume and testicular pain.  Musculoskeletal: Positive for back pain. Negative for neck pain.  Neurological: Negative for headaches.   A complete 10 system review of systems was obtained and all systems are negative except as  noted in the HPI and PMH.   Allergies  Review of patient's allergies indicates no known allergies.  Home Medications   Prior to Admission medications   Medication Sig Start Date End Date Taking? Authorizing Provider  acetaminophen (TYLENOL) 500 MG tablet Take 500 mg by mouth every 6 (six) hours as needed.   Yes Historical Provider, MD  FLUoxetine (PROZAC) 20 MG capsule Take 20 mg by mouth daily.   Yes Historical Provider, MD  OLANZapine (ZYPREXA) 5 MG tablet Take 5 mg by mouth every morning.   Yes Historical Provider, MD   Triage Vitals: BP 118/70  Pulse 105  Temp(Src) 98.2 F (36.8 C) (Oral)  Resp 20  Ht 5\' 11"  (1.803 m)  Wt 155 lb (70.308 kg)  BMI 21.63 kg/m2  SpO2 98%  Physical Exam  Nursing note and vitals reviewed. Constitutional: He is oriented to person, place, and time. He appears well-developed and well-nourished. No distress.  HENT:  Head: Normocephalic and atraumatic.  Mouth/Throat: Oropharynx is clear and moist. No oropharyngeal exudate.  Eyes: Conjunctivae and EOM are normal. Pupils are equal, round, and reactive to light.  Neck: Normal range of motion. Neck supple. No tracheal deviation present.  No C spine tenderness  Cardiovascular: Normal rate.   Pulmonary/Chest: Effort normal. No respiratory distress.  Abdominal: Soft. Bowel sounds are normal. There is no tenderness.  Musculoskeletal: Normal range of motion. He exhibits  tenderness (lower thoracic upper lumbar).  5/5 strength in bilateral lower extremities. Ankle plantar and dorsiflexion intact. Great toe extension intact bilaterally. +2 DP and PT pulses. +2 patellar reflexes bilaterally. Normal gait.  Neurological: He is alert and oriented to person, place, and time. No cranial nerve deficit. He exhibits normal muscle tone. Coordination normal.  Skin: Skin is warm and dry.  Psychiatric: He has a normal mood and affect. His behavior is normal.   ED Course  Procedures (including critical care  time) DIAGNOSTIC STUDIES: Oxygen Saturation is 98% on RA, normal by my interpretation.    COORDINATION OF CARE: 5:02 PM-Discussed treatment plan which includes imaging, UA and meds with pt at bedside and pt agreed to plan.   Labs Review Labs Reviewed  URINALYSIS, ROUTINE W REFLEX MICROSCOPIC - Abnormal; Notable for the following:    Specific Gravity, Urine >1.030 (*)    All other components within normal limits    Imaging Review Dg Chest 2 View  05/14/2014   CLINICAL DATA:  Fall from ladder.  Mid thoracic spine pain.  EXAM: CHEST  2 VIEW  COMPARISON:  None.  FINDINGS: Normal cardiac and mediastinal contours. No consolidative pulmonary opacities no pleural effusion or pneumothorax. Regional skeleton is unremarkable.  IMPRESSION: No acute cardiopulmonary process.   Electronically Signed   By: Annia Beltrew  Davis M.D.   On: 05/14/2014 17:42   Dg Thoracic Spine 2 View  05/14/2014   CLINICAL DATA:  Patient status post fall from ladder. Mid thoracic spine pain.  EXAM: THORACIC SPINE - 2 VIEW  COMPARISON:  DG CHEST 2 VIEW dated 05/14/2014; DG THORACIC SPINE W/ SWIMMERS 3V dated 05/13/2014  FINDINGS: Normal anatomic alignment of the thoracic spine. Preservation of the vertebral body and intervertebral disc space heights. No evidence for static listhesis. No evidence for acute thoracic spine fracture.  IMPRESSION: No evidence for acute thoracic spine fracture.   Electronically Signed   By: Annia Beltrew  Davis M.D.   On: 05/14/2014 17:43   Dg Lumbar Spine Complete  05/14/2014   CLINICAL DATA:  Status post fall from ladder. Mid thoracic spine back pain.  EXAM: LUMBAR SPINE - COMPLETE 4+ VIEW  COMPARISON:  None.  FINDINGS: Five non-rib-bearing lumbar type vertebral bodies are demonstrated in normal anatomic alignment. Preservation of the vertebral body and intervertebral disc space heights. SI joints are unremarkable. Bowel gas pattern is unremarkable.  IMPRESSION: No evidence for acute lumbar spine fracture.    Electronically Signed   By: Annia Beltrew  Davis M.D.   On: 05/14/2014 17:45   Ct Head Wo Contrast  05/14/2014   CLINICAL DATA:  Pain post trauma  EXAM: CT HEAD WITHOUT CONTRAST  TECHNIQUE: Contiguous axial images were obtained from the base of the skull through the vertex without intravenous contrast.  COMPARISON:  None.  FINDINGS: The ventricles are normal in size and configuration. There is no mass, hemorrhage, extra-axial fluid collection, or midline shift. The gray-white compartments are normal.  The bony calvarium appears intact. The mastoid air cells are clear. There is a larger tension cyst in the inferior right maxillary antrum. There is extensive ethmoid sinus disease bilaterally. There is patchy sphenoid sinus disease bilaterally. There is mild mucosal thickening in both maxillary antra.  IMPRESSION: Multifocal paranasal sinus disease.  Study otherwise unremarkable.   Electronically Signed   By: Bretta BangWilliam  Woodruff M.D.   On: 05/14/2014 18:03     EKG Interpretation None      MDM   Final diagnoses:  Fall  Back injury  Fall from about 8 feet off a ladder this morning onto his lower back and middle back. He denies any loss of consciousness. Denies any chest pain or shortness of breath. Denies any head or neck pain. Denies abdominal pain. No focal weakness, numbness or tingling.  C-spine nontender. Breath sounds equal bilaterally. Tenderness to lower thoracic upper lumbar without step-off or deformity. Neurologically intact.  X-rays negative for acute abnormality. Chest x-ray negative without pneumothorax. No hematuria on UA. Patient is ambulatory. He is informed that he'll be sore for the next several days. We'll treat with anti-inflammatories. Followup with PCP. Return precautions discussed. HR improved to 61.  BP 119/82  Pulse 61  Temp(Src) 98.2 F (36.8 C) (Oral)  Resp 16  Ht 5\' 11"  (1.803 m)  Wt 155 lb (70.308 kg)  BMI 21.63 kg/m2  SpO2 100%  I personally performed the services  described in this documentation, which was scribed in my presence. The recorded information has been reviewed and is accurate.    Glynn OctaveStephen Jamion Carter, MD 05/14/14 [phone]

## 2014-05-14 NOTE — Discharge Instructions (Signed)
Contusion Take the pain medication as prescribed. Follow up with your doctor. Return to the ED if you develop new or worsening symptoms A contusion is a deep bruise. Contusions are the result of an injury that caused bleeding under the skin. The contusion may turn blue, purple, or yellow. Minor injuries will give you a painless contusion, but more severe contusions may stay painful and swollen for a few weeks.  CAUSES  A contusion is usually caused by a blow, trauma, or direct force to an area of the body. SYMPTOMS   Swelling and redness of the injured area.  Bruising of the injured area.  Tenderness and soreness of the injured area.  Pain. DIAGNOSIS  The diagnosis can be made by taking a history and physical exam. An X-ray, CT scan, or MRI may be needed to determine if there were any associated injuries, such as fractures. TREATMENT  Specific treatment will depend on what area of the body was injured. In general, the best treatment for a contusion is resting, icing, elevating, and applying cold compresses to the injured area. Over-the-counter medicines may also be recommended for pain control. Ask your caregiver what the best treatment is for your contusion. HOME CARE INSTRUCTIONS   Put ice on the injured area.  Put ice in a plastic bag.  Place a towel between your skin and the bag.  Leave the ice on for 15-20 minutes, 03-04 times a day.  Only take over-the-counter or prescription medicines for pain, discomfort, or fever as directed by your caregiver. Your caregiver may recommend avoiding anti-inflammatory medicines (aspirin, ibuprofen, and naproxen) for 48 hours because these medicines may increase bruising.  Rest the injured area.  If possible, elevate the injured area to reduce swelling. SEEK IMMEDIATE MEDICAL CARE IF:   You have increased bruising or swelling.  You have pain that is getting worse.  Your swelling or pain is not relieved with medicines. MAKE SURE YOU:    Understand these instructions.  Will watch your condition.  Will get help right away if you are not doing well or get worse. Document Released: 09/26/2005 Document Revised: 03/10/2012 Document Reviewed: 10/22/2011 [facility] Patient Information 2014 [place], [place].

## 2016-03-30 ENCOUNTER — Encounter (HOSPITAL_COMMUNITY): Payer: Self-pay | Admitting: Emergency Medicine

## 2016-03-30 ENCOUNTER — Emergency Department (HOSPITAL_COMMUNITY): Payer: Medicaid Other

## 2016-03-30 ENCOUNTER — Emergency Department (HOSPITAL_COMMUNITY)
Admission: EM | Admit: 2016-03-30 | Discharge: 2016-03-30 | Disposition: A | Discharge status: Home / Self Care | Payer: Medicaid Other | Attending: Emergency Medicine | Admitting: Emergency Medicine

## 2016-03-30 DIAGNOSIS — Y929 Unspecified place or not applicable: Secondary | ICD-10-CM | POA: Insufficient documentation

## 2016-03-30 DIAGNOSIS — S161XXA Strain of muscle, fascia and tendon at neck level, initial encounter: Secondary | ICD-10-CM | POA: Insufficient documentation

## 2016-03-30 DIAGNOSIS — W11XXXA Fall on and from ladder, initial encounter: Secondary | ICD-10-CM | POA: Insufficient documentation

## 2016-03-30 DIAGNOSIS — Y939 Activity, unspecified: Secondary | ICD-10-CM | POA: Insufficient documentation

## 2016-03-30 DIAGNOSIS — S199XXA Unspecified injury of neck, initial encounter: Secondary | ICD-10-CM | POA: Diagnosis present

## 2016-03-30 DIAGNOSIS — Y999 Unspecified external cause status: Secondary | ICD-10-CM | POA: Insufficient documentation

## 2016-03-30 DIAGNOSIS — W19XXXA Unspecified fall, initial encounter: Secondary | ICD-10-CM

## 2016-03-30 DIAGNOSIS — F17210 Nicotine dependence, cigarettes, uncomplicated: Secondary | ICD-10-CM | POA: Diagnosis not present

## 2016-03-30 MED ORDER — CYCLOBENZAPRINE HCL 10 MG PO TABS
10.0000 mg | ORAL_TABLET | Freq: Once | ORAL | Status: AC
  Administered 2016-03-30: 10 mg via ORAL
  Filled 2016-03-30: qty 1

## 2016-03-30 MED ORDER — HYDROCODONE-ACETAMINOPHEN 5-325 MG PO TABS: ORAL_TABLET | ORAL | Status: DC

## 2016-03-30 MED ORDER — CYCLOBENZAPRINE HCL 10 MG PO TABS: 10.0000 mg | ORAL_TABLET | Freq: Three times a day (TID) | ORAL | Status: DC | PRN

## 2016-03-30 MED ORDER — OXYCODONE-ACETAMINOPHEN 5-325 MG PO TABS
1.0000 | ORAL_TABLET | Freq: Once | ORAL | Status: AC
  Administered 2016-03-30: 1 via ORAL
  Filled 2016-03-30: qty 1

## 2016-03-30 NOTE — ED Notes (Signed)
PT stated he fell off a ladder 724ft and laded on his upper back flat x2 days ago. PT states middle back pain and neck pain with looking towards the right x2 days.

## 2016-03-30 NOTE — Discharge Instructions (Signed)

## 2016-03-31 NOTE — ED Provider Notes (Signed)
CSN: [id number]     Arrival date & time 03/30/16  1250 History   First MD Initiated Contact with Patient 03/30/16 1532     Chief Complaint  Patient presents with  . Fall     (Consider location/radiation/quality/duration/timing/severity/associated sxs/prior Treatment) HPI   Jorge Ford is a 35 y.o. male who presents to the Emergency Department complaining of neck pain and middle back pain for 2 days  Pain began after an approximately 4 ft fall from a ladder onto the ground.  He reports dull aching pain in his neck and back that is worse with neck movement to the right.  He denies head injury, visual changes, numbness or weakness of the extremities, LOC, and low back pain.  He has been taking tylenol without relief.    Past Medical History  Diagnosis Date  . Back pain    Past Surgical History  Procedure Laterality Date  . Cyst excision     History reviewed. No pertinent family history. Social History  Substance Use Topics  . Smoking status: Current Every Day Smoker -- 1.00 packs/day    Types: Cigarettes  . Smokeless tobacco: None  . Alcohol Use: Yes     Comment: Occ    Review of Systems  Constitutional: Negative for fever and chills.  Eyes: Negative for visual disturbance.  Gastrointestinal: Negative for nausea, vomiting and abdominal pain.  Genitourinary: Negative for dysuria and difficulty urinating.  Musculoskeletal: Positive for neck pain. Negative for joint swelling.  Skin: Negative for color change and wound.  Neurological: Negative for dizziness, weakness, numbness and headaches.  All other systems reviewed and are negative.     Allergies  Review of patient's allergies indicates no known allergies.  Home Medications   Prior to Admission medications   Medication Sig Start Date End Date Taking? Authorizing Provider  acetaminophen (TYLENOL) 500 MG tablet Take 500 mg by mouth every 6 (six) hours as needed.    Historical Provider, MD  cyclobenzaprine  (FLEXERIL) 10 MG tablet Take 1 tablet (10 mg total) by mouth 3 (three) times daily as needed. 03/30/16   Morene Cecilio, PA-C  FLUoxetine (PROZAC) 20 MG capsule Take 20 mg by mouth daily.    Historical Provider, MD  HYDROcodone-acetaminophen (NORCO/VICODIN) 5-325 MG tablet Take one-two tabs po q 4-6 hrs prn pain 03/30/16   Laylanie Kruczek, PA-C  ibuprofen (ADVIL,MOTRIN) 800 MG tablet Take 1 tablet (800 mg total) by mouth 3 (three) times daily. 05/14/14   Glynn OctaveStephen Rancour, MD  OLANZapine (ZYPREXA) 5 MG tablet Take 5 mg by mouth every morning.    Historical Provider, MD   BP 123/80 mmHg  Pulse 54  Temp(Src) 97.7 F (36.5 C) (Oral)  Resp 16  SpO2 100% Physical Exam  Constitutional: He is oriented to person, place, and time. He appears well-developed and well-nourished. No distress.  HENT:  Head: Normocephalic and atraumatic.  Mouth/Throat: Oropharynx is clear and moist.  Eyes: EOM are normal. Pupils are equal, round, and reactive to light.  Neck: Phonation normal. Spinous process tenderness and muscular tenderness present. No rigidity. No erythema and normal range of motion present. No Kernig's sign noted. No thyromegaly present.  Cardiovascular: Normal rate, regular rhythm and intact distal pulses.   No murmur heard. Pulmonary/Chest: Effort normal and breath sounds normal. No respiratory distress. He exhibits no tenderness.  Musculoskeletal: He exhibits tenderness. He exhibits no edema.       Cervical back: He exhibits tenderness. He exhibits normal range of motion, no bony tenderness, no swelling,  no deformity, no spasm and normal pulse.       Back:  ttp of the cervical spine and right cervical paraspinal muscles.  No edema or bony step offs.  5/5 strength against resistance of the bilateral UE's  Lymphadenopathy:    He has no cervical adenopathy.  Neurological: He is alert and oriented to person, place, and time. He has normal strength. No sensory deficit. He exhibits normal muscle tone.  Coordination normal.  Reflex Scores:      Tricep reflexes are 2+ on the right side and 2+ on the left side.      Bicep reflexes are 2+ on the right side and 2+ on the left side. Skin: Skin is warm and dry.  Nursing note and vitals reviewed.   ED Course  Procedures (including critical care time) Labs Review Labs Reviewed - No data to display  Imaging Review Dg Thoracic Spine 2 View  03/30/2016  CLINICAL DATA:  Fall from 6 foot ladder cleaning gutters 2 days ago, mid thoracic pain EXAM: THORACIC SPINE 2 VIEWS COMPARISON:  05/14/2014 FINDINGS: Three views of thoracic spine submitted. No acute fracture or subluxation. Alignment, disc spaces and vertebral body heights are preserved. IMPRESSION: Negative. Electronically Signed   By: Natasha Mead M.D.   On: 03/30/2016 14:12   Ct Cervical Spine Wo Contrast  03/30/2016  CLINICAL DATA:  Right-sided neck pain after fall off a 6 foot ladder 2 days ago. EXAM: CT CERVICAL SPINE WITHOUT CONTRAST TECHNIQUE: Multidetector CT imaging of the cervical spine was performed without intravenous contrast. Multiplanar CT image reconstructions were also generated. COMPARISON:  None. FINDINGS: No fracture or spondylolisthesis is noted. Disk spaces and posterior facet joints appear intact. Mild apical scarring is noted in the visualized lung fields. IMPRESSION: No significant abnormality seen in the cervical spine. Electronically Signed   By: Lupita Raider, M.D.   On: 03/30/2016 14:04   I have personally reviewed and evaluated these images and lab results as part of my medical decision-making.   EKG Interpretation None      MDM   Final diagnoses:  Cervical strain, acute, initial encounter  Fall, initial encounter    Pt well appearing, non-toxic.  No focal neuro deficits.  No concerning sx's for emergent neurological process.  XR reassuring.  He agrees to symptomatic tx and close PMD f/u.  Appears stable for d/c    Pauline Aus, PA-C 03/31/16  2142  Bethann Berkshire, MD 04/05/16 [phone]

## 2016-06-24 ENCOUNTER — Emergency Department (HOSPITAL_COMMUNITY): Payer: Medicaid Other

## 2016-06-24 ENCOUNTER — Encounter (HOSPITAL_COMMUNITY): Payer: Self-pay | Admitting: Emergency Medicine

## 2016-06-24 ENCOUNTER — Emergency Department (HOSPITAL_COMMUNITY)
Admission: EM | Admit: 2016-06-24 | Discharge: 2016-06-24 | Disposition: A | Discharge status: Home / Self Care | Payer: Medicaid Other | Attending: Emergency Medicine | Admitting: Emergency Medicine

## 2016-06-24 DIAGNOSIS — Y929 Unspecified place or not applicable: Secondary | ICD-10-CM | POA: Diagnosis not present

## 2016-06-24 DIAGNOSIS — Y990 Civilian activity done for income or pay: Secondary | ICD-10-CM | POA: Diagnosis not present

## 2016-06-24 DIAGNOSIS — Y9389 Activity, other specified: Secondary | ICD-10-CM | POA: Insufficient documentation

## 2016-06-24 DIAGNOSIS — F17210 Nicotine dependence, cigarettes, uncomplicated: Secondary | ICD-10-CM | POA: Diagnosis not present

## 2016-06-24 DIAGNOSIS — M25531 Pain in right wrist: Secondary | ICD-10-CM | POA: Diagnosis present

## 2016-06-24 DIAGNOSIS — X58XXXA Exposure to other specified factors, initial encounter: Secondary | ICD-10-CM | POA: Diagnosis not present

## 2016-06-24 DIAGNOSIS — S66911A Strain of unspecified muscle, fascia and tendon at wrist and hand level, right hand, initial encounter: Secondary | ICD-10-CM | POA: Diagnosis not present

## 2016-06-24 MED ORDER — NAPROXEN 500 MG PO TABS: 500.0000 mg | ORAL_TABLET | Freq: Two times a day (BID) | ORAL | Status: DC

## 2016-06-24 NOTE — ED Provider Notes (Signed)
CSN: [id number]     Arrival date & time 06/24/16  1431 History   First MD Initiated Contact with Patient 06/24/16 1529     Chief Complaint  Patient presents with  . Wrist Pain     (Consider location/radiation/quality/duration/timing/severity/associated sxs/prior Treatment) Patient is a 35 y.o. male presenting with wrist pain. The history is provided by the patient.  Wrist Pain Pertinent negatives include no chest pain, no abdominal pain, no headaches and no shortness of breath.  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 Patient reports pain in the right wrist. He is right hand dominant. Had also describes numbness to his thumb and middle finger. No history of any direct injury or trauma. No history of anything similar in the past.  Past Medical History  Diagnosis Date  . Back pain    Past Surgical History  Procedure Laterality Date  . Cyst excision     History reviewed. No pertinent family history. Social History  Substance Use Topics  . Smoking status: Current Every Day Smoker -- 1.00 packs/day    Types: Cigarettes  . Smokeless tobacco: None  . Alcohol Use: Yes     Comment: Occ    Review of Systems  Constitutional: Negative for fever.  HENT: Negative for congestion.   Respiratory: Negative for shortness of breath.   Cardiovascular: Negative for chest pain.  Gastrointestinal: Negative for abdominal pain.  Musculoskeletal: Negative for back pain and neck pain.  Skin: Negative for rash.  Neurological: Positive for weakness and numbness. Negative for headaches.  Hematological: Does not bruise/bleed easily.  Psychiatric/Behavioral: Negative for confusion.      Allergies  Review of patient's allergies indicates no known allergies.  Home Medications   Prior to Admission medications   Medication Sig Start Date End Date Taking? Authorizing Provider  acetaminophen (TYLENOL) 500 MG tablet Take 500 mg by mouth every 6 (six) hours as needed for mild  pain.     Historical Provider, MD  cyclobenzaprine (FLEXERIL) 10 MG tablet Take 1 tablet (10 mg total) by mouth 3 (three) times daily as needed. Patient not taking: Reported on 06/24/2016 03/30/16   Pauline Ausammy Triplett, PA-C  HYDROcodone-acetaminophen (NORCO/VICODIN) 5-325 MG tablet Take one-two tabs po q 4-6 hrs prn pain Patient not taking: Reported on 06/24/2016 03/30/16   Tammy Triplett, PA-C  ibuprofen (ADVIL,MOTRIN) 800 MG tablet Take 1 tablet (800 mg total) by mouth 3 (three) times daily. Patient not taking: Reported on 06/24/2016 05/14/14   Glynn OctaveStephen Rancour, MD  naproxen (NAPROSYN) 500 MG tablet Take 1 tablet (500 mg total) by mouth 2 (two) times daily. 06/24/16   Vanetta MuldersScott Makarios Madlock, MD   BP 130/76 mmHg  Pulse 75  Temp(Src) 97.7 F (36.5 C) (Oral)  Resp 20  Ht 6' (1.829 m)  Wt 71.668 kg  BMI 21.42 kg/m2  SpO2 100% Physical Exam  Constitutional: He is oriented to person, place, and time. He appears well-developed and well-nourished. No distress.  HENT:  Head: Normocephalic and atraumatic.  Eyes: Conjunctivae and EOM are normal.  Neck: Normal range of motion.  Cardiovascular: Normal rate, regular rhythm and normal heart sounds.   Pulmonary/Chest: Effort normal and breath sounds normal. No respiratory distress.  Abdominal: Soft. Bowel sounds are normal. There is no tenderness.  Musculoskeletal: He exhibits no edema.  Right hand and forearm without any deformities. Some increased pain of movement of the right wrist. No erythema. Cap refill to fingers is 1 second. Radial pulses 2+. Sensation intact. No snuffbox tenderness.  No elbow tenderness.  Neurological: He is alert and oriented to person, place, and time. No cranial nerve deficit. He exhibits normal muscle tone. Coordination normal.  Skin: Skin is warm. No rash noted. No erythema.  Nursing note and vitals reviewed.   ED Course  Procedures (including critical care time) Labs Review Labs Reviewed - No data to display  Imaging Review Dg  Wrist Complete Right  06/24/2016  CLINICAL DATA:  Right wrist pain for 2 weeks.  No known injury. EXAM: RIGHT WRIST - COMPLETE 3+ VIEW COMPARISON:  None. FINDINGS: Osseous alignment is normal. Bone mineralization is normal. No fracture line or displaced fracture fragment seen. No acute-appearing cortical irregularity or osseous lesion. No significant degenerative change. Surrounding soft tissues are unremarkable. IMPRESSION: Negative. Electronically Signed   By: Bary RichardStan  Maynard M.D.   On: 06/24/2016 16:28   I have personally reviewed and evaluated these images and lab results as part of my medical decision-making.   EKG Interpretation None      MDM   Final diagnoses:  Wrist strain, right, initial encounter    Patient with pain in the right wrist suggestive of overuse type strain. Will rest with a splint and limited use of the right hand at work for 10 days. Follow-up with orthopedics. X-rays negative. No snuffbox tenderness. No direct injury or trauma to the wrist.      Vanetta MuldersScott Kalub Morillo, MD 06/24/16 [phone]

## 2016-06-24 NOTE — Discharge Instructions (Signed)
Wear the Velcro forearm splint to rest the wrist. Take the Naprosyn as directed. Make an appointment to follow-up with orthopedics locally. Work note provided. Return for any new or worse symptoms.

## 2016-06-24 NOTE — ED Notes (Signed)
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

## 2021-03-18 ENCOUNTER — Encounter (HOSPITAL_COMMUNITY): Payer: Self-pay

## 2021-03-18 ENCOUNTER — Inpatient Hospital Stay (HOSPITAL_COMMUNITY)
Admission: EM | Admit: 2021-03-18 | Discharge: 2021-03-21 | DRG: 281 | Disposition: A | Discharge status: Home / Self Care | Payer: Medicaid Other | Attending: Internal Medicine | Admitting: Internal Medicine

## 2021-03-18 ENCOUNTER — Other Ambulatory Visit: Payer: Self-pay

## 2021-03-18 ENCOUNTER — Emergency Department (HOSPITAL_COMMUNITY): Payer: Medicaid Other

## 2021-03-18 DIAGNOSIS — F17210 Nicotine dependence, cigarettes, uncomplicated: Secondary | ICD-10-CM | POA: Diagnosis not present

## 2021-03-18 DIAGNOSIS — E872 Acidosis, unspecified: Secondary | ICD-10-CM

## 2021-03-18 DIAGNOSIS — F1010 Alcohol abuse, uncomplicated: Secondary | ICD-10-CM | POA: Diagnosis present

## 2021-03-18 DIAGNOSIS — R112 Nausea with vomiting, unspecified: Secondary | ICD-10-CM

## 2021-03-18 DIAGNOSIS — K760 Fatty (change of) liver, not elsewhere classified: Secondary | ICD-10-CM | POA: Diagnosis present

## 2021-03-18 DIAGNOSIS — I214 Non-ST elevation (NSTEMI) myocardial infarction: Principal | ICD-10-CM | POA: Diagnosis present

## 2021-03-18 DIAGNOSIS — D72829 Elevated white blood cell count, unspecified: Secondary | ICD-10-CM

## 2021-03-18 DIAGNOSIS — Z20822 Contact with and (suspected) exposure to covid-19: Secondary | ICD-10-CM | POA: Diagnosis present

## 2021-03-18 DIAGNOSIS — Z720 Tobacco use: Secondary | ICD-10-CM

## 2021-03-18 DIAGNOSIS — R079 Chest pain, unspecified: Secondary | ICD-10-CM | POA: Diagnosis present

## 2021-03-18 LAB — CBC WITH DIFFERENTIAL/PLATELET
Abs Immature Granulocytes: 0.1 10*3/uL — ABNORMAL HIGH (ref 0.00–0.07)
Basophils Absolute: 0 10*3/uL (ref 0.0–0.1)
Basophils Relative: 0 %
Eosinophils Absolute: 0 10*3/uL (ref 0.0–0.5)
Eosinophils Relative: 0 %
HCT: 46.3 % (ref 39.0–52.0)
Hemoglobin: 16.1 g/dL (ref 13.0–17.0)
Immature Granulocytes: 1 %
Lymphocytes Relative: 8 %
Lymphs Abs: 1.4 10*3/uL (ref 0.7–4.0)
MCH: 30.9 pg (ref 26.0–34.0)
MCHC: 34.8 g/dL (ref 30.0–36.0)
MCV: 88.9 fL (ref 80.0–100.0)
Monocytes Absolute: 0.7 10*3/uL (ref 0.1–1.0)
Monocytes Relative: 4 %
Neutro Abs: 14.8 10*3/uL — ABNORMAL HIGH (ref 1.7–7.7)
Neutrophils Relative %: 87 %
Platelets: 250 10*3/uL (ref 150–400)
RBC: 5.21 MIL/uL (ref 4.22–5.81)
RDW: 13.2 % (ref 11.5–15.5)
WBC: 17 10*3/uL — ABNORMAL HIGH (ref 4.0–10.5)
nRBC: 0 % (ref 0.0–0.2)

## 2021-03-18 NOTE — ED Triage Notes (Signed)
Pt tx from [facility] with c/o CP and N/V since 7 pm today. Pt was out last night drinking and thought he just hd a bad hangover. Pt had an inuitial trop of 717 and repeat was 2245. WBC count 19.8 and Lipase 87 at [facility]. Pt has no hx, takes no meds, and no allergies. Pt received morphine, zofran, 325 ASA, and 4000 units heparin at [facility].

## 2021-03-18 NOTE — ED Provider Notes (Signed)
[facility] EMERGENCY DEPARTMENT Provider Note   CSN: [id number] Arrival date & time: 03/18/21  2304     History Chief Complaint  Patient presents with  . Chest Pain    Jorge Ford is a 40 y.o. male.  40 y/o male with hx of back pain presents to the ED in transfer from [facility] for further evaluation of chest pain and troponin elevation.   HPI per OSH: "40 year old male presents to the emergency department complaining of epigastric pain, chest pain, nausea, vomiting since awakening this morning. States he did drink alcohol last night does not normally have nausea vomiting. Denies pancreatitis denies cardiac history. Does use tobacco. Denies fever chills or rigors. Vomitus does not have blood partially digested food bowel movements unchanged no blood per rectum. EMS was called transported to the emergency department IV established given 4 baby aspirin and 2 mg of Zofran with relief. He is lying on the stretcher now stating he has discomfort to the epigastrium is complaining of thirst."  Patient received 4000 units heparin PTA.   Chest Pain      Past Medical History:  Diagnosis Date  . Back pain     Patient Active Problem List   Diagnosis Date Noted  . Low back pain 03/19/2013    Past Surgical History:  Procedure Laterality Date  . CYST EXCISION         History reviewed. No pertinent family history.  Social History   Tobacco Use  . Smoking status: Current Every Day Smoker    Packs/day: 1.00    Types: Cigarettes  Substance Use Topics  . Alcohol use: Yes    Comment: Occ  . Drug use: Yes    Types: Marijuana    Comment: twice a day    Home Medications Prior to Admission medications   Medication Sig Start Date End Date Taking? Authorizing Provider  acetaminophen (TYLENOL) 500 MG tablet Take 500 mg by mouth every 6 (six) hours as needed for moderate pain or headache.   Yes [provider]    Allergies    Patient has no  known allergies.  Review of Systems   Review of Systems  Unable to perform ROS: Acuity of condition  Cardiovascular: Positive for chest pain.    Physical Exam Updated Vital Signs BP 120/75   Pulse 76   Temp 98.4 F (36.9 C) (Oral)   Resp 16   Ht 6' (1.829 m)   Wt 63.5 kg   SpO2 98%   BMI 18.99 kg/m   Physical Exam Vitals and nursing note reviewed.  Constitutional:      Appearance: He is well-developed. He is ill-appearing.     Comments: Dry heaving. Appears uncomfortable.  HENT:     Head: Normocephalic and atraumatic.  Eyes:     General: No scleral icterus.    Conjunctiva/sclera: Conjunctivae normal.  Cardiovascular:     Rate and Rhythm: Regular rhythm. Tachycardia present.     Pulses: Normal pulses.  Pulmonary:     Effort: Pulmonary effort is normal. No respiratory distress.     Breath sounds: No stridor. No wheezing, rhonchi or rales.     Comments: Lungs CTAB Abdominal:     Palpations: Abdomen is soft.     Tenderness: There is abdominal tenderness.     Comments: Epigastric TTP. Abdomen nondistended, soft.  Musculoskeletal:        General: Normal range of motion.     Cervical back: Normal range of motion.  Skin:  General: Skin is warm and dry.     Coloration: Skin is not pale.     Findings: No erythema or rash.  Neurological:     Mental Status: He is alert and oriented to person, place, and time.     Coordination: Coordination normal.     Comments: Moving all extremities spontaneously.  Psychiatric:        Behavior: Behavior normal.     ED Results / Procedures / Treatments   Labs (all labs ordered are listed, but only abnormal results are displayed) Labs Reviewed  CBC WITH DIFFERENTIAL/PLATELET - Abnormal; Notable for the following components:      Result Value   WBC 17.0 (*)    Neutro Abs 14.8 (*)    Abs Immature Granulocytes 0.10 (*)    All other components within normal limits  COMPREHENSIVE METABOLIC PANEL - Abnormal; Notable for the  following components:   CO2 14 (*)    AST 59 (*)    Total Bilirubin 1.8 (*)    Anion gap 16 (*)    All other components within normal limits  TROPONIN I (HIGH SENSITIVITY) - Abnormal; Notable for the following components:   Troponin I (High Sensitivity) 3,231 (*)    All other components within normal limits  RESP PANEL BY RT-PCR (FLU A&B, COVID) ARPGX2  LIPASE, BLOOD  TROPONIN I (HIGH SENSITIVITY)    EKG EKG Interpretation  Date/Time:  Sunday March 19 2021 02:49:02 EDT Ventricular Rate:  89 PR Interval:    QRS Duration: 79 QT Interval:  372 QTC Calculation: 453 R Axis:   78 Text Interpretation: Sinus rhythm Biatrial enlargement Abnormal R-wave progression, early transition QRS widening improved repolarization abnormality improved Confirmed by Glynn Octave [id number]) on 03/19/2021 2:57:38 AM   Radiology DG Chest Port 1 View  Result Date: 03/18/2021 CLINICAL DATA:  Chest pain, nausea and vomiting since 7 p.m. EXAM: PORTABLE CHEST 1 VIEW COMPARISON:  Radiograph 07/05/2017 FINDINGS: Chronic hyperinflation. No consolidation, features of edema, pneumothorax, or effusion. Pulmonary vascularity is normally distributed. The cardiomediastinal contours are unremarkable. No acute osseous or soft tissue abnormality. Bilateral metallic nipple ornamentation. Telemetry leads overlie the chest. IMPRESSION: 1. No acute cardiopulmonary abnormality. 2. Chronic hyperinflation. Electronically Signed   By: Kreg Shropshire M.D.   On: 03/18/2021 23:55   CT Angio Chest/Abd/Pel for Dissection W and/or Wo Contrast  Result Date: 03/19/2021 CLINICAL DATA:  Chest pain, elevated troponin, heavy drinking last night, now with nausea, vomiting and epigastric/chest pain EXAM: CT ANGIOGRAPHY CHEST, ABDOMEN AND PELVIS TECHNIQUE: Non-contrast CT of the chest was initially obtained. Multidetector CT imaging through the chest, abdomen and pelvis was performed using the standard protocol during bolus administration of  intravenous contrast. Multiplanar reconstructed images and MIPs were obtained and reviewed to evaluate the vascular anatomy. CONTRAST:  OMNIPAQUE IOHEXOL 350 MG/ML SOLN COMPARISON:  Chest radiograph 03/18/2021 FINDINGS: CTA CHEST FINDINGS Cardiovascular: A noncontrast CT of the chest was performed initially. No hyperdense mural thickening to suggest intramural hematoma. Postcontrast administration there is satisfactory preferential opacification of the thoracic aorta. Mild motion artifact seen at the level of the aortic root, ascending aorta and proximal descending arch. Accounting for motion artifact, no acute luminal abnormality is seen. No periaortic stranding or hemorrhage. Normal 3 vessel branching of the aortic arch. Proximal great vessels are unremarkable. Central pulmonary arteries are normal caliber. No large central or lobar filling defects within limitations non tailored examination of the pulmonary arteries. Normal heart size. No pericardial effusion. Mediastinum/Nodes: No mediastinal fluid  or gas. Normal thyroid gland and thoracic inlet. No acute abnormality of the trachea or esophagus. No worrisome mediastinal, hilar or axillary adenopathy. Lungs/Pleura: No consolidation, features of edema, pneumothorax, or effusion. Mild hyperinflation. Tiny benign calcified granuloma seen in the anterior right upper lobe (7/69). No suspicious pulmonary nodules or masses. Musculoskeletal: 0 mild discogenic changes and multilevel Schmorl's node formations present in the spine. Degenerative changes most pronounced at the T11-12 disc space and in the lower cervical levels. Mild arthrosis in the bilateral shoulders as well. No acute or worrisome chest wall abnormalities. Review of the MIP images confirms the above findings. CTA ABDOMEN AND PELVIS FINDINGS VASCULAR Aorta: Normal caliber aorta without aneurysm, dissection, vasculitis or significant stenosis. Celiac: Patent without evidence of aneurysm, dissection,  vasculitis or significant stenosis. SMA: Patent without evidence of aneurysm, dissection, vasculitis or significant stenosis. Renals: Single renal arteries bilaterally. Both renal arteries are patent without evidence of aneurysm, dissection, vasculitis, fibromuscular dysplasia or significant stenosis. IMA: Patent without acute luminal abnormality, aneurysm or visible dissection. Inflow: Inflow vessels and proximal outflow vasculature is widely patent bilaterally. No evidence of aneurysm, dissection or vasculitis. Veins: No obvious venous abnormality within the limitations of this arterial phase study. Review of the MIP images confirms the above findings. NON-VASCULAR Hepatobiliary: While some of the hypoattenuation of liver parenchyma may be related to arterial phase imaging, sparing along the gallbladder fossa does suggest some degree of hepatic steatosis. Smooth liver surface contour. No concerning liver lesion. Normal gallbladder and biliary tree without visible calcified gallstone. Pancreas: No pancreatic ductal dilatation or surrounding inflammatory changes. Spleen: Heterogeneous splenic enhancement is typical for the arterial phase of imaging. Normal in size. No concerning splenic lesions. Adrenals/Urinary Tract: Normal adrenal glands. Kidneys are normally located with symmetric enhancement. No suspicious renal lesion, urolithiasis or hydronephrosis. Urinary bladder is largely decompressed at the time of exam and therefore poorly evaluated by CT imaging. Mild bladder wall thickening may be related to underdistention. No other gross bladder abnormality. Stomach/Bowel: Distal esophagus, stomach and duodenum are unremarkable. No small bowel thickening or dilatation. No evidence of obstruction. Normal appendix in the right lower quadrant. Colon largely decompressed at the time of exam. No significant colonic thickening or dilatation accounting for underdistention. Limited assessment of the mesentery given a  paucity of intraperitoneal fat. Lymphatic: No suspicious or enlarged lymph nodes in the included lymphatic chains. Reproductive: The prostate and seminal vesicles are unremarkable. No acute abnormality of the included external genitalia. Other: No abdominopelvic free fluid or free gas. No bowel containing hernias. Musculoskeletal: No acute osseous abnormality or suspicious osseous lesion. Minimal discogenic changes. Review of the MIP images confirms the above findings. IMPRESSION: 1. No evidence of acute aortic syndrome. 2. Mild circumferential bladder wall thickening, possibly related to underdistention. Correlate with urinalysis to exclude cystitis. 3. No other acute intrathoracic or intra-abdominal process. 4. Hepatic steatosis. Electronically Signed   By: Kreg Shropshire M.D.   On: 03/19/2021 02:30    Procedures .Critical Care Performed by: Antony Madura, PA-C Authorized by: Antony Madura, PA-C   Critical care provider statement:    Critical care time (minutes):  45   Critical care was necessary to treat or prevent imminent or life-threatening deterioration of the following conditions:  Cardiac failure (NSTEMI)   Critical care was time spent personally by me on the following activities:  Discussions with consultants, evaluation of patient's response to treatment, examination of patient, ordering and performing treatments and interventions, ordering and review of laboratory studies, ordering and review of  radiographic studies, pulse oximetry, re-evaluation of patient's condition, obtaining history from patient or surrogate and review of old charts     Medications Ordered in ED Medications  iohexol (OMNIPAQUE) 350 MG/ML injection 100 mL (100 mLs Intravenous Contrast Given 03/19/21 0110)    ED Course  I have reviewed the triage vital signs and the nursing notes.  Pertinent labs & imaging results that were available during my care of the patient were reviewed by me and considered in my medical  decision making (see chart for details).  Clinical Course as of 03/19/21 0408  Sat Mar 18, 2021  2330 Bedside echo completed by [facility]. No pericardial effusion or RV collapse. Normal appearing LV function. Overall, imaging at bedside normal. Plan to further investigate cause of pain; will defer plan to take to cath lab at this time. [KH]  Sun Mar 19, 2021  0240 Vital signs stable.  CT chest, abdomen, pelvis negative for acute pathology.  Specifically no aortic changes.  No Boerhaave's.  MD [facility] updated.  [KH]  [phone] Cardiology fellow at bedside. [KH]  0251 Repeat EKG improved. Cards to follow. Recommend medical admission for troponin trending.  [KH]  0406 Spoke with Dr. Loney Lohathore who will admit. [KH]    Clinical Course User Index [KH] Darylene PriceHumes, Sandara Tyree, PA-C   MDM Rules/Calculators/A&P                          40 year old male [facility]  Woke up yesterday morning with pain in his chest and epigastrium, nausea, vomiting.  Had been drinking the night prior.  Noted to have troponin elevation of 717.  This has continued to trend upward; most recently 3231.  He has received aspirin as well as 4000 units heparin.  Vitals have been stable.  Underwent extensive work-up including dissection study, bedside echocardiogram.  These have been reassuring.  Repeat EKGs have started to normalize.  Unclear cause of troponin elevation.  May be related to myocarditis.  There is no pericardial effusion.  He denies worsening symptoms when supine.  Lower suspicion for pericarditis.  Will admit to the hospital for continued observation and troponin trending.  Cardiology to follow in consultation.   Final Clinical Impression(s) / ED Diagnoses Final diagnoses:  NSTEMI (non-ST elevated myocardial infarction) [facility](HCC)    Rx / DC Orders ED Discharge Orders    None       Antony MaduraHumes, Haylyn Halberg, PA-C 03/19/21 [id number]    Glynn Octaveancour, Stephen, MD 03/19/21  [id number]

## 2021-03-19 ENCOUNTER — Encounter (HOSPITAL_COMMUNITY): Payer: Self-pay | Admitting: Internal Medicine

## 2021-03-19 ENCOUNTER — Inpatient Hospital Stay (HOSPITAL_COMMUNITY): Payer: Medicaid Other

## 2021-03-19 ENCOUNTER — Emergency Department (HOSPITAL_COMMUNITY): Payer: Medicaid Other

## 2021-03-19 DIAGNOSIS — Z720 Tobacco use: Secondary | ICD-10-CM

## 2021-03-19 DIAGNOSIS — K760 Fatty (change of) liver, not elsewhere classified: Secondary | ICD-10-CM | POA: Diagnosis present

## 2021-03-19 DIAGNOSIS — I214 Non-ST elevation (NSTEMI) myocardial infarction: Secondary | ICD-10-CM | POA: Diagnosis present

## 2021-03-19 DIAGNOSIS — R079 Chest pain, unspecified: Secondary | ICD-10-CM

## 2021-03-19 DIAGNOSIS — E872 Acidosis, unspecified: Secondary | ICD-10-CM

## 2021-03-19 DIAGNOSIS — R112 Nausea with vomiting, unspecified: Secondary | ICD-10-CM

## 2021-03-19 DIAGNOSIS — F1010 Alcohol abuse, uncomplicated: Secondary | ICD-10-CM | POA: Diagnosis present

## 2021-03-19 DIAGNOSIS — Z20822 Contact with and (suspected) exposure to covid-19: Secondary | ICD-10-CM | POA: Diagnosis present

## 2021-03-19 DIAGNOSIS — F17210 Nicotine dependence, cigarettes, uncomplicated: Secondary | ICD-10-CM | POA: Diagnosis present

## 2021-03-19 DIAGNOSIS — R7989 Other specified abnormal findings of blood chemistry: Secondary | ICD-10-CM | POA: Diagnosis not present

## 2021-03-19 DIAGNOSIS — D72829 Elevated white blood cell count, unspecified: Secondary | ICD-10-CM

## 2021-03-19 LAB — URINALYSIS, ROUTINE W REFLEX MICROSCOPIC
Bilirubin Urine: NEGATIVE
Glucose, UA: NEGATIVE mg/dL
Hgb urine dipstick: NEGATIVE
Ketones, ur: 80 mg/dL — AB
Leukocytes,Ua: NEGATIVE
Nitrite: NEGATIVE
Protein, ur: NEGATIVE mg/dL
Specific Gravity, Urine: >1.046 — ABNORMAL HIGH (ref 1.005–1.030)
pH: 5 (ref 5.0–8.0)

## 2021-03-19 LAB — COMPREHENSIVE METABOLIC PANEL
ALT: 33 U/L (ref 0–44)
AST: 59 U/L — ABNORMAL HIGH (ref 15–41)
Albumin: 4.5 g/dL (ref 3.5–5.0)
Alkaline Phosphatase: 47 U/L (ref 38–126)
Anion gap: 16 — ABNORMAL HIGH (ref 5–15)
BUN: 18 mg/dL (ref 6–20)
CO2: 14 mmol/L — ABNORMAL LOW (ref 22–32)
Calcium: 9.2 mg/dL (ref 8.9–10.3)
Chloride: 106 mmol/L (ref 98–111)
Creatinine, Ser: 1.03 mg/dL (ref 0.61–1.24)
GFR, Estimated: >60 mL/min (ref >60)
Glucose, Bld: 90 mg/dL (ref 70–99)
Potassium: 3.9 mmol/L (ref 3.5–5.1)
Sodium: 136 mmol/L (ref 135–145)
Total Bilirubin: 1.8 mg/dL — ABNORMAL HIGH (ref 0.3–1.2)
Total Protein: 7.6 g/dL (ref 6.5–8.1)

## 2021-03-19 LAB — BASIC METABOLIC PANEL
Anion gap: 12 (ref 5–15)
BUN: 17 mg/dL (ref 6–20)
CO2: 17 mmol/L — ABNORMAL LOW (ref 22–32)
Calcium: 9.2 mg/dL (ref 8.9–10.3)
Chloride: 105 mmol/L (ref 98–111)
Creatinine, Ser: 1.04 mg/dL (ref 0.61–1.24)
GFR, Estimated: >60 mL/min (ref >60)
Glucose, Bld: 99 mg/dL (ref 70–99)
Potassium: 4 mmol/L (ref 3.5–5.1)
Sodium: 134 mmol/L — ABNORMAL LOW (ref 135–145)

## 2021-03-19 LAB — RESP PANEL BY RT-PCR (FLU A&B, COVID) ARPGX2
Influenza A by PCR: NEGATIVE
Influenza B by PCR: NEGATIVE
SARS Coronavirus 2 by RT PCR: NEGATIVE

## 2021-03-19 LAB — ECHOCARDIOGRAM COMPLETE
AR max vel: 2.19 cm2
AV Area VTI: 1.84 cm2
AV Area mean vel: 1.95 cm2
AV Mean grad: 5 mmHg
AV Peak grad: 8.4 mmHg
Ao pk vel: 1.45 m/s
Area-P 1/2: 3.42 cm2
Height: 72 in
S' Lateral: 3.1 cm
Weight: 2240 oz

## 2021-03-19 LAB — CBC
HCT: 44.8 % (ref 39.0–52.0)
Hemoglobin: 15.2 g/dL (ref 13.0–17.0)
MCH: 30.6 pg (ref 26.0–34.0)
MCHC: 33.9 g/dL (ref 30.0–36.0)
MCV: 90.3 fL (ref 80.0–100.0)
Platelets: 265 10*3/uL (ref 150–400)
RBC: 4.96 MIL/uL (ref 4.22–5.81)
RDW: 13.3 % (ref 11.5–15.5)
WBC: 18.4 10*3/uL — ABNORMAL HIGH (ref 4.0–10.5)
nRBC: 0 % (ref 0.0–0.2)

## 2021-03-19 LAB — TROPONIN I (HIGH SENSITIVITY)
Troponin I (High Sensitivity): 3231 ng/L (ref <18)
Troponin I (High Sensitivity): 6788 ng/L (ref <18)
Troponin I (High Sensitivity): 6729 ng/L (ref <18)
Troponin I (High Sensitivity): 7470 ng/L (ref <18)

## 2021-03-19 LAB — HEPARIN LEVEL (UNFRACTIONATED)
Heparin Unfractionated: <0.1 IU/mL — ABNORMAL LOW (ref 0.30–0.70)
Heparin Unfractionated: <0.1 IU/mL — ABNORMAL LOW (ref 0.30–0.70)

## 2021-03-19 LAB — MAGNESIUM: Magnesium: 2 mg/dL (ref 1.7–2.4)

## 2021-03-19 LAB — ETHANOL: Alcohol, Ethyl (B): <10 mg/dL (ref <10)

## 2021-03-19 LAB — PHOSPHORUS: Phosphorus: 5 mg/dL — ABNORMAL HIGH (ref 2.5–4.6)

## 2021-03-19 LAB — SALICYLATE LEVEL: Salicylate Lvl: <7 mg/dL — ABNORMAL LOW (ref 7.0–30.0)

## 2021-03-19 LAB — HIV ANTIBODY (ROUTINE TESTING W REFLEX): HIV Screen 4th Generation wRfx: NONREACTIVE

## 2021-03-19 LAB — LACTIC ACID, PLASMA: Lactic Acid, Venous: 1.1 mmol/L (ref 0.5–1.9)

## 2021-03-19 LAB — PROCALCITONIN: Procalcitonin: 0.1 ng/mL

## 2021-03-19 LAB — LIPASE, BLOOD: Lipase: 39 U/L (ref 11–51)

## 2021-03-19 MED ORDER — HEPARIN BOLUS VIA INFUSION
2000.0000 [IU] | Freq: Once | INTRAVENOUS | Status: AC
  Administered 2021-03-19: 2000 [IU] via INTRAVENOUS
  Filled 2021-03-19: qty 2000

## 2021-03-19 MED ORDER — HEPARIN BOLUS VIA INFUSION
3000.0000 [IU] | Freq: Once | INTRAVENOUS | Status: AC
  Administered 2021-03-19: 3000 [IU] via INTRAVENOUS
  Filled 2021-03-19: qty 3000

## 2021-03-19 MED ORDER — HEPARIN (PORCINE) 25000 UT/250ML-% IV SOLN
1200.0000 [IU]/h | INTRAVENOUS | Status: DC
  Administered 2021-03-19: 750 [IU]/h via INTRAVENOUS
  Administered 2021-03-20: 1200 [IU]/h via INTRAVENOUS
  Filled 2021-03-19 (×2): qty 250

## 2021-03-19 MED ORDER — THIAMINE HCL 100 MG PO TABS
100.0000 mg | ORAL_TABLET | Freq: Every day | ORAL | Status: DC
  Administered 2021-03-19 – 2021-03-21 (×3): 100 mg via ORAL
  Filled 2021-03-19 (×3): qty 1

## 2021-03-19 MED ORDER — IOHEXOL 350 MG/ML SOLN
100.0000 mL | Freq: Once | INTRAVENOUS | Status: AC | PRN
  Administered 2021-03-19: 100 mL via INTRAVENOUS

## 2021-03-19 MED ORDER — ACETAMINOPHEN 325 MG PO TABS: 650.0000 mg | ORAL_TABLET | ORAL | Status: DC | PRN

## 2021-03-19 MED ORDER — LORAZEPAM 2 MG/ML IJ SOLN
1.0000 mg | INTRAMUSCULAR | Status: DC | PRN
Start: 2021-03-19 — End: 2021-03-22

## 2021-03-19 MED ORDER — FOLIC ACID 1 MG PO TABS
1.0000 mg | ORAL_TABLET | Freq: Every day | ORAL | Status: DC
  Administered 2021-03-19 – 2021-03-21 (×3): 1 mg via ORAL
  Filled 2021-03-19 (×3): qty 1

## 2021-03-19 MED ORDER — ASPIRIN EC 81 MG PO TBEC
81.0000 mg | DELAYED_RELEASE_TABLET | Freq: Every day | ORAL | Status: DC
  Administered 2021-03-19 – 2021-03-21 (×3): 81 mg via ORAL
  Filled 2021-03-19 (×3): qty 1

## 2021-03-19 MED ORDER — ASPIRIN 81 MG PO CHEW: 81.0000 mg | CHEWABLE_TABLET | Freq: Every day | ORAL | Status: DC

## 2021-03-19 MED ORDER — SODIUM CHLORIDE 0.9 % IV SOLN: INTRAVENOUS | Status: AC

## 2021-03-19 MED ORDER — SODIUM CHLORIDE 0.9 % IV SOLN
12.5000 mg | Freq: Four times a day (QID) | INTRAVENOUS | Status: DC | PRN
  Filled 2021-03-19 (×4): qty 0.5

## 2021-03-19 MED ORDER — ADULT MULTIVITAMIN W/MINERALS CH
1.0000 | ORAL_TABLET | Freq: Every day | ORAL | Status: DC
  Administered 2021-03-19 – 2021-03-21 (×3): 1 via ORAL
  Filled 2021-03-19 (×3): qty 1

## 2021-03-19 MED ORDER — SODIUM CHLORIDE 0.9 % IV BOLUS
1000.0000 mL | Freq: Once | INTRAVENOUS | Status: AC
  Administered 2021-03-19: 1000 mL via INTRAVENOUS

## 2021-03-19 MED ORDER — HEPARIN BOLUS VIA INFUSION
1500.0000 [IU] | Freq: Once | INTRAVENOUS | Status: AC
  Administered 2021-03-19: 1500 [IU] via INTRAVENOUS
  Filled 2021-03-19: qty 1500

## 2021-03-19 MED ORDER — THIAMINE HCL 100 MG/ML IJ SOLN: 100.0000 mg | Freq: Every day | INTRAMUSCULAR | Status: DC

## 2021-03-19 MED ORDER — NICOTINE 21 MG/24HR TD PT24
21.0000 mg | MEDICATED_PATCH | Freq: Every day | TRANSDERMAL | Status: DC
  Administered 2021-03-19 – 2021-03-21 (×3): 21 mg via TRANSDERMAL
  Filled 2021-03-19 (×3): qty 1

## 2021-03-19 MED ORDER — LORAZEPAM 1 MG PO TABS: 1.0000 mg | ORAL_TABLET | ORAL | Status: DC | PRN

## 2021-03-19 NOTE — H&P (Signed)
History and Physical    Jorge Ford ZHG:[id number] DOB: 1981/12/21 DOA: 03/18/2021  PCP: Neale Burly, MD Patient coming from: Home  Chief Complaint: Chest pain, epigastric pain, nausea, vomiting  HPI: Jorge Ford is a 40 y.o. male with medical history significant of chronic back pain, tobacco use presented to the ED as a transfer from [facility] for further evaluation of chest pain and elevated troponin.  He presented to your [facility] ED with complaints of chest pain, epigastric pain, nausea, and vomiting since 3/19 a.m.  EKG revealed peaked T waves without ST changes.  His initial high-sensitivity troponin 717, repeat 2245.  Urine drug screen positive for THC only.  Patient received morphine, Zofran, aspirin 325 mg, and was started on IV heparin at [facility].  He was urgently transferred to [facility] due to concern for ACS.  At [facility] ED, he continued to complain of epigastric pain, shortness of breath, and nausea/vomiting.  He was tachycardic with heart rate in the 110s but other vital signs stable.  Labs showing WBC 17.0, hemoglobin 16.1, platelet count 250K.  Sodium 136, potassium 3.9, chloride 106, bicarb 14, anion gap 16, BUN 18, creatinine 1.0, glucose 90.  AST 59, ALT 33, alk phos 47, T bili 1.8.  Lipase normal.  High-sensitivity troponin 3231 > 6788.  SARS-CoV-2 PCR test negative.  Influenza panel negative.  Chest x-ray showing no acute cardiopulmonary abnormality.  CT angiogram chest/abdomen/pelvis negative for acute intrathoracic or intra-abdominal process.  Showing hepatic steatosis. Cardiology was consulted and did a bedside echocardiogram which revealed normal biventricular function, no effusion.  Repeat EKG revealed wide-complex tachycardia and no STEMI.  CTA chest/abdomen/pelvis negative for acute pathology.  Repeat EKG revealed normal sinus rhythm, narrow complex QRS, and no ST/T wave abnormalities.  Patient's chest pain resolved.  Cardiology decided  to defer emergent coronary angiography after discussing case with interventional cardiology.  TR[facility] called to admit.  Patient states he drinks regularly and normally splits a 12 pack of beer with his boss every day after work. Friday night he went out to drink with his brother and thinks he drank heavily.  He woke up yesterday morning with sharp substernal chest pain and epigastric pain associated with nausea and vomiting.  He initially thought he was having a hangover but symptoms persisted and he decided to seek medical attention.  States his chest pain and abdominal pain have now resolved and he is no longer vomiting.  He is hungry and wants to eat.  He smokes marijuana and denies any other drug use.  Review of Systems:  All systems reviewed and apart from history of presenting illness, are negative.  Past Medical History:  Diagnosis Date  . Back pain     Past Surgical History:  Procedure Laterality Date  . CYST EXCISION       reports that he has been smoking cigarettes. He has been smoking about 1.00 pack per day. He does not have any smokeless tobacco history on file. He reports current alcohol use. He reports current drug use. Drug: Marijuana.  No Known Allergies  History reviewed. No pertinent family history.  Prior to Admission medications   Medication Sig Start Date End Date Taking? Authorizing Provider  acetaminophen (TYLENOL) 500 MG tablet Take 500 mg by mouth every 6 (six) hours as needed for moderate pain or headache.   Yes [provider]    Physical Exam: Vitals:   03/19/21 0030 03/19/21 0200 03/19/21 0230 03/19/21 0300  BP: Marland Kitchen)  147/102 120/74 116/74 120/75  Pulse: 60 96 81 76  Resp: (!) 22 13 17 16   Temp:      TempSrc:      SpO2: 98% 98% 98% 98%  Weight:      Height:        Physical Exam Constitutional:      General: He is not in acute distress. HENT:     Head: Normocephalic and atraumatic.  Eyes:     Extraocular Movements: Extraocular movements  intact.     Conjunctiva/sclera: Conjunctivae normal.  Cardiovascular:     Rate and Rhythm: Regular rhythm. Tachycardia present.     Pulses: Normal pulses.     Comments: Slightly tachycardic Pulmonary:     Effort: Pulmonary effort is normal. No respiratory distress.     Breath sounds: Normal breath sounds. No wheezing or rales.  Abdominal:     General: Bowel sounds are normal. There is no distension.     Palpations: Abdomen is soft.     Tenderness: There is no abdominal tenderness.  Musculoskeletal:        General: No swelling or tenderness.     Cervical back: Normal range of motion and neck supple.  Skin:    General: Skin is warm and dry.  Neurological:     General: No focal deficit present.     Mental Status: He is alert and oriented to person, place, and time.     Labs on Admission: I have personally reviewed following labs and imaging studies  CBC: Recent Labs  Lab 03/18/21 2322  WBC 17.0*  NEUTROABS 14.8*  HGB 16.1  HCT 46.3  MCV 88.9  PLT 952   Basic Metabolic Panel: Recent Labs  Lab 03/18/21 2322  NA 136  K 3.9  CL 106  CO2 14*  GLUCOSE 90  BUN 18  CREATININE 1.03  CALCIUM 9.2   GFR: Estimated Creatinine Clearance: 86.5 mL/min (by C-G formula based on SCr of 1.03 mg/dL). Liver Function Tests: Recent Labs  Lab 03/18/21 2322  AST 59*  ALT 33  ALKPHOS 47  BILITOT 1.8*  PROT 7.6  ALBUMIN 4.5   Recent Labs  Lab 03/18/21 2322  LIPASE 39   No results for input(s): AMMONIA in the last 168 hours. Coagulation Profile: No results for input(s): INR, PROTIME in the last 168 hours. Cardiac Enzymes: No results for input(s): CKTOTAL, CKMB, CKMBINDEX, TROPONINI in the last 168 hours. BNP (last 3 results) No results for input(s): PROBNP in the last 8760 hours. HbA1C: No results for input(s): HGBA1C in the last 72 hours. CBG: No results for input(s): GLUCAP in the last 168 hours. Lipid Profile: No results for input(s): CHOL, HDL, LDLCALC, TRIG,  CHOLHDL, LDLDIRECT in the last 72 hours. Thyroid Function Tests: No results for input(s): TSH, T4TOTAL, FREET4, T3FREE, THYROIDAB in the last 72 hours. Anemia Panel: No results for input(s): VITAMINB12, FOLATE, FERRITIN, TIBC, IRON, RETICCTPCT in the last 72 hours. Urine analysis:    Component Value Date/Time   COLORURINE YELLOW 05/14/2014 1723   APPEARANCEUR CLEAR 05/14/2014 1723   LABSPEC >1.030 (H) 05/14/2014 1723   PHURINE 5.5 05/14/2014 1723   GLUCOSEU NEGATIVE 05/14/2014 1723   HGBUR NEGATIVE 05/14/2014 1723   BILIRUBINUR NEGATIVE 05/14/2014 1723   KETONESUR NEGATIVE 05/14/2014 1723   PROTEINUR NEGATIVE 05/14/2014 1723   UROBILINOGEN 1.0 05/14/2014 1723   NITRITE NEGATIVE 05/14/2014 1723   LEUKOCYTESUR NEGATIVE 05/14/2014 1723    Radiological Exams on Admission: DG Chest Port 1 View  Result Date:  03/18/2021 CLINICAL DATA:  Chest pain, nausea and vomiting since 7 p.m. EXAM: PORTABLE CHEST 1 VIEW COMPARISON:  Radiograph 07/05/2017 FINDINGS: Chronic hyperinflation. No consolidation, features of edema, pneumothorax, or effusion. Pulmonary vascularity is normally distributed. The cardiomediastinal contours are unremarkable. No acute osseous or soft tissue abnormality. Bilateral metallic nipple ornamentation. Telemetry leads overlie the chest. IMPRESSION: 1. No acute cardiopulmonary abnormality. 2. Chronic hyperinflation. Electronically Signed   By: Lovena Le M.D.   On: 03/18/2021 23:55   CT Angio Chest/Abd/Pel for Dissection W and/or Wo Contrast  Result Date: 03/19/2021 CLINICAL DATA:  Chest pain, elevated troponin, heavy drinking last night, now with nausea, vomiting and epigastric/chest pain EXAM: CT ANGIOGRAPHY CHEST, ABDOMEN AND PELVIS TECHNIQUE: Non-contrast CT of the chest was initially obtained. Multidetector CT imaging through the chest, abdomen and pelvis was performed using the standard protocol during bolus administration of intravenous contrast. Multiplanar reconstructed  images and MIPs were obtained and reviewed to evaluate the vascular anatomy. CONTRAST:  162m OMNIPAQUE IOHEXOL 350 MG/ML SOLN COMPARISON:  Chest radiograph 03/18/2021 FINDINGS: CTA CHEST FINDINGS Cardiovascular: A noncontrast CT of the chest was performed initially. No hyperdense mural thickening to suggest intramural hematoma. Postcontrast administration there is satisfactory preferential opacification of the thoracic aorta. Mild motion artifact seen at the level of the aortic root, ascending aorta and proximal descending arch. Accounting for motion artifact, no acute luminal abnormality is seen. No periaortic stranding or hemorrhage. Normal 3 vessel branching of the aortic arch. Proximal great vessels are unremarkable. Central pulmonary arteries are normal caliber. No large central or lobar filling defects within limitations non tailored examination of the pulmonary arteries. Normal heart size. No pericardial effusion. Mediastinum/Nodes: No mediastinal fluid or gas. Normal thyroid gland and thoracic inlet. No acute abnormality of the trachea or esophagus. No worrisome mediastinal, hilar or axillary adenopathy. Lungs/Pleura: No consolidation, features of edema, pneumothorax, or effusion. Mild hyperinflation. Tiny benign calcified granuloma seen in the anterior right upper lobe (7/69). No suspicious pulmonary nodules or masses. Musculoskeletal: 0 mild discogenic changes and multilevel Schmorl's node formations present in the spine. Degenerative changes most pronounced at the T11-12 disc space and in the lower cervical levels. Mild arthrosis in the bilateral shoulders as well. No acute or worrisome chest wall abnormalities. Review of the MIP images confirms the above findings. CTA ABDOMEN AND PELVIS FINDINGS VASCULAR Aorta: Normal caliber aorta without aneurysm, dissection, vasculitis or significant stenosis. Celiac: Patent without evidence of aneurysm, dissection, vasculitis or significant stenosis. SMA: Patent  without evidence of aneurysm, dissection, vasculitis or significant stenosis. Renals: Single renal arteries bilaterally. Both renal arteries are patent without evidence of aneurysm, dissection, vasculitis, fibromuscular dysplasia or significant stenosis. IMA: Patent without acute luminal abnormality, aneurysm or visible dissection. Inflow: Inflow vessels and proximal outflow vasculature is widely patent bilaterally. No evidence of aneurysm, dissection or vasculitis. Veins: No obvious venous abnormality within the limitations of this arterial phase study. Review of the MIP images confirms the above findings. NON-VASCULAR Hepatobiliary: While some of the hypoattenuation of liver parenchyma may be related to arterial phase imaging, sparing along the gallbladder fossa does suggest some degree of hepatic steatosis. Smooth liver surface contour. No concerning liver lesion. Normal gallbladder and biliary tree without visible calcified gallstone. Pancreas: No pancreatic ductal dilatation or surrounding inflammatory changes. Spleen: Heterogeneous splenic enhancement is typical for the arterial phase of imaging. Normal in size. No concerning splenic lesions. Adrenals/Urinary Tract: Normal adrenal glands. Kidneys are normally located with symmetric enhancement. No suspicious renal lesion, urolithiasis or hydronephrosis. Urinary bladder  is largely decompressed at the time of exam and therefore poorly evaluated by CT imaging. Mild bladder wall thickening may be related to underdistention. No other gross bladder abnormality. Stomach/Bowel: Distal esophagus, stomach and duodenum are unremarkable. No small bowel thickening or dilatation. No evidence of obstruction. Normal appendix in the right lower quadrant. Colon largely decompressed at the time of exam. No significant colonic thickening or dilatation accounting for underdistention. Limited assessment of the mesentery given a paucity of intraperitoneal fat. Lymphatic: No  suspicious or enlarged lymph nodes in the included lymphatic chains. Reproductive: The prostate and seminal vesicles are unremarkable. No acute abnormality of the included external genitalia. Other: No abdominopelvic free fluid or free gas. No bowel containing hernias. Musculoskeletal: No acute osseous abnormality or suspicious osseous lesion. Minimal discogenic changes. Review of the MIP images confirms the above findings. IMPRESSION: 1. No evidence of acute aortic syndrome. 2. Mild circumferential bladder wall thickening, possibly related to underdistention. Correlate with urinalysis to exclude cystitis. 3. No other acute intrathoracic or intra-abdominal process. 4. Hepatic steatosis. Electronically Signed   By: Lovena Le M.D.   On: 03/19/2021 02:30    Assessment/Plan Principal Problem:   NSTEMI (non-ST elevated myocardial infarction) [facility]) Active Problems:   Nausea & vomiting   Leukocytosis   Metabolic acidosis   Tobacco use   NSTEMI: Patient presenting with complaints of chest pain, epigastric pain, nausea, and vomiting after binge drinking the night before.  EKG without acute ST/T wave abnormalities. High-sensitivity troponin  717 >2245> 3231 > 6788.  Bedside echocardiogram revealed normal biventricular function and no effusion.  CTA chest/abdomen/pelvis negative for acute intrathoracic or intra-abdominal process.  No cocaine or amphetamines detected on UDS.  While in the ED, his chest pain resolved and cardiology decided to defer emergent coronary angiography after discussing the case with interventional cardiology. -Admit to progressive care unit.  Continue to trend troponin.  TTE ordered.  He was given full dose aspirin.  Continue IV heparin.  Continue aspirin 81 mg daily.  Cardiology following.  Epigastric pain, nausea, vomiting: Lipase normal.  No significant elevation of LFTs.  CT showing hepatic steatosis and no acute process. -Antiemetic as needed.  Leukocytosis: WBC 17.0.  No  fever, tachycardia, or hypotension at present to suggest sepsis. CT chest and abdomen negative for acute infectious process. -UA and blood cultures ordered.  Check procalcitonin level.  Check lactic acid level.  Repeat CBC in a.m.  Metabolic acidosis: Bicarb 14, anion gap 16.  Possibly related to alcohol use. -Check ethanol, salicylate, and lactate levels.  IV fluid hydration, repeat BMP.   Tobacco use: Smokes 1 pack of cigarettes daily. -NicoDerm patch and counseling  Alcohol abuse -CIWA protocol; Ativan as needed.  Thiamine, folate, and multivitamin.  Monitor mag and Phos levels.  DVT prophylaxis: Heparin Code Status: Full code Family Communication: No family available this time. Disposition Plan: Status is: Inpatient  Remains inpatient appropriate because:Inpatient level of care appropriate due to severity of illness   Dispo: The patient is from: Home              Anticipated d/c is to: Home              Patient currently is not medically stable to d/c.   Difficult to place patient No  Level of care: Level of care: Progressive   The medical decision making on this patient was of high complexity and the patient is at high risk for clinical deterioration, therefore this is a level 3 visit.  Shela Leff MD [facility]  If 7PM-7AM, please contact night-coverage [email or web address]  03/19/2021, 6:04 AM

## 2021-03-19 NOTE — ED Notes (Signed)
Trop 3231 critical result called by lab

## 2021-03-19 NOTE — Progress Notes (Signed)
Cardiology Moonlighter Note  CTA chest abdomen pelvis performed and negative for acute pathology. Patient reassessed. Currently chest pain free without intervention. Still reporting some abdominal pain that patient attributes to multiple episodes of vomiting earlier in the day and not having anything to eat. Vital signs improved, HR now 81, BP normal. ECG repeat showing normal sinus rhythm, narrow complex QRS, no ST/T wave abnormalities.   In absence of chest pain or ECG changes, will defer emergent coronary angiography at this time. Patient has received full dose aspirin. Case discussed with on call interventional cardiologist Dr Eldridge Dace, who agrees with this plan.   Recommendations: - recommend admission - please order formal echo to be performed in AM - cont aspirin 81mg  daily - would give at least 1L IV fluid bolus as patient appears volume deplete and has not taken much PO - will discuss whether invasive angiography is warranted within the coming 24-48 hrs - please page cardiology on call if patient develops recurrent chest pain or ECG/telemetry changes  , MD Cardiology Fellow, PGY-8

## 2021-03-19 NOTE — ED Notes (Signed)
Paged hospitalist for diet order

## 2021-03-19 NOTE — Progress Notes (Signed)
ANTICOAGULATION CONSULT NOTE - Initial Consult  Pharmacy Consult for heparin Indication: chest pain/ACS  No Known Allergies  Patient Measurements: Height: 6' (182.9 cm) Weight: 63.5 kg (140 lb) IBW/kg (Calculated) : 77.6  Vital Signs: Temp: 98.4 F (36.9 C) (03/19 2311) Temp Source: Oral (03/19 2311) BP: 120/75 (03/20 0300) Pulse Rate: 76 (03/20 0300)  Labs: Recent Labs    03/18/21 2322 03/19/21 0311  HGB 16.1  --   HCT 46.3  --   PLT 250  --   CREATININE 1.03  --   TROPONINIHS 3,231* 6,788*    Estimated Creatinine Clearance: 86.5 mL/min (by C-G formula based on SCr of 1.03 mg/dL).   Medical History: Past Medical History:  Diagnosis Date  . Back pain     Assessment: 40yo male c/o N/V followed by waxing and waning epigastric CP, troponin found to be elevated and rising (6283>1517), to begin heparin.  Goal of Therapy:  Heparin level 0.3-0.7 units/ml Monitor platelets by anticoagulation protocol: Yes   Plan:  Will give heparin 3000 units IV bolus x1 followed by gtt at 750 units/hr and monitor heparin levels and CBC.  Vernard Gambles, PharmD, BCPS  03/19/2021,6:05 AM

## 2021-03-19 NOTE — Progress Notes (Signed)
Patient seen and examined.  Was just in the emergency room.  Admitted by nighttime physician early morning, transfer from [facility] ER to our ER due to non-STEMI.  Patient started having midsternal chest pain after heavier than usual drinking and vomiting.  Electrolytes were normal.  Renal functions were normal.  Currently symptoms improved.  Denies any ongoing chest pain.  Denies any nausea vomiting and tolerating diet. EKG with no acute ischemia.  Troponins [phone] subsequently. Currently on aspirin and heparin. Followed by cardiology. Possible cardiac cath tomorrow.  Echocardiogram pending. We will continue to monitor.

## 2021-03-19 NOTE — Plan of Care (Signed)

## 2021-03-19 NOTE — Consult Note (Signed)
Cardiology Consultation:   Patient ID: Jorge Ford MRN: [id number]; DOB: 1981/02/03  Admit date: 03/18/2021 Date of Consult: 03/19/2021  Primary Care Provider: Toma Deiters, MD Primary Cardiologist: No primary care provider on file.  Primary Electrophysiologist:  None    Patient Profile:   Jorge Ford is a 40 y.o. male with no significant medical history who is being seen today for the evaluation of chest pain and elevated troponin.  History of Present Illness:   Jorge Ford states that he was out drinking a lot of alcohol last night and woke up this morning with a bad hangover. Around noon today he developed nausea and vomiting, then epigastric chest pain that waxed and waned. These symptoms were severe and he had never felt anything like this before. He went to [facility] and an ECG revealed peaked T waves without ST elevation or depression. An initial troponin came back elevated in the 700's. He was urgently transferred to [facility] out of concern for acute coronary syndrome.   On arrival to [facility], the patient continued to describe symptoms of severe epigastric pain, shortness of breath, and nausea/vomiting. He was noted to be tachycardic with heart rates in the 110's occasionally in wide complex IVCD pattern. The remainder of his vital signs were within reasonable limits. A follow up troponin was elevated to >2000. A bedside echocardiogram revealed normal biventricular function, no effusion. A repeat ECG revealed wide complex tachycardia, IVCD patter, repolarization abnormalities, and no ST elevation.   The patient notably denies any illicit substance use other than marijuana. He has no know cardiovascular history.   Heart Pathway Score:     Past Medical History:  Diagnosis Date  . Back pain     Past Surgical History:  Procedure Laterality Date  . CYST EXCISION       Home Medications:  Prior to Admission medications   Medication Sig Start  Date End Date Taking? Authorizing Provider  acetaminophen (TYLENOL) 500 MG tablet Take 500 mg by mouth every 6 (six) hours as needed for mild pain.     [provider]  cyclobenzaprine (FLEXERIL) 10 MG tablet Take 1 tablet (10 mg total) by mouth 3 (three) times daily as needed. Patient not taking: Reported on 06/24/2016 03/30/16   Pauline Aus, PA-C  HYDROcodone-acetaminophen (NORCO/VICODIN) 5-325 MG tablet Take one-two tabs po q 4-6 hrs prn pain Patient not taking: Reported on 06/24/2016 03/30/16   Pauline Aus, PA-C  ibuprofen (ADVIL,MOTRIN) 800 MG tablet Take 1 tablet (800 mg total) by mouth 3 (three) times daily. Patient not taking: Reported on 06/24/2016 05/14/14   Glynn Octave, MD  naproxen (NAPROSYN) 500 MG tablet Take 1 tablet (500 mg total) by mouth 2 (two) times daily. 06/24/16   Vanetta Mulders, MD    Inpatient Medications: Scheduled Meds:  Continuous Infusions:  PRN Meds:   Allergies:   No Known Allergies  Social History:   Social History   Socioeconomic History  . Marital status: Married    Spouse name: Not on file  . Number of children: Not on file  . Years of education: Not on file  . Highest education level: Not on file  Occupational History  . Not on file  Tobacco Use  . Smoking status: Current Every Day Smoker    Packs/day: 1.00    Types: Cigarettes  . Smokeless tobacco: Not on file  Substance and Sexual Activity  . Alcohol use: Yes    Comment: Occ  . Drug use: Yes  Types: Marijuana    Comment: twice a day  . Sexual activity: Not on file  Other Topics Concern  . Not on file  Social History Narrative  . Not on file   Social Determinants of Health   Financial Resource Strain: Not on file  Food Insecurity: Not on file  Transportation Needs: Not on file  Physical Activity: Not on file  Stress: Not on file  Social Connections: Not on file  Intimate Partner Violence: Not on file    Family History:   History reviewed. No pertinent  family history.   ROS:  Please see the history of present illness.   All other ROS reviewed and negative.     Physical Exam/Data:   Vitals:   03/18/21 2315 03/18/21 2330 03/18/21 2345 03/19/21 0000  BP: (!) 145/131 120/89  (!) 139/92  Pulse: (!) 112 (!) 111 (!) 104 (!) 114  Resp: 15 (!) 24 16 14   Temp:      TempSrc:      SpO2: 100% 100% 97% 100%  Weight:      Height:       No intake or output data in the 24 hours ending 03/19/21 0039 Last 3 Weights 03/18/2021 06/24/2016 05/14/2014  Weight (lbs) 140 lb 158 lb 155 lb  Weight (kg) 63.504 kg 71.668 kg 70.308 kg     Body mass index is 18.99 kg/m.  General:  Uncomfortable appearing, tachypneic HEENT: normal Cardiac:  normal S1, S2; tachycardic; no murmur Lungs:  clear to auscultation bilaterally, no wheezing, rhonchi or rales  Abd: soft, nontender, no hepatomegaly  Ext: no edema  EKG:  The EKG was personally reviewed and demonstrates:  Wide complex tachycardia, unclear rhythm, no p waves appreciated, repolarization abnormality  Relevant CV Studies: None  Laboratory Data:  High Sensitivity Troponin:  No results for input(s): TROPONINIHS in the last 720 hours.   ChemistryNo results for input(s): NA, K, CL, CO2, GLUCOSE, BUN, CREATININE, CALCIUM, GFRNONAA, GFRAA, ANIONGAP in the last 168 hours.  No results for input(s): PROT, ALBUMIN, AST, ALT, ALKPHOS, BILITOT in the last 168 hours. Hematology Recent Labs  Lab 03/18/21 2322  WBC 17.0*  RBC 5.21  HGB 16.1  HCT 46.3  MCV 88.9  MCH 30.9  MCHC 34.8  RDW 13.2  PLT 250   BNPNo results for input(s): BNP, PROBNP in the last 168 hours.  DDimer No results for input(s): DDIMER in the last 168 hours.   Radiology/Studies:  DG Chest Port 1 View  Result Date: 03/18/2021 CLINICAL DATA:  Chest pain, nausea and vomiting since 7 p.m. EXAM: PORTABLE CHEST 1 VIEW COMPARISON:  Radiograph 07/05/2017 FINDINGS: Chronic hyperinflation. No consolidation, features of edema, pneumothorax, or  effusion. Pulmonary vascularity is normally distributed. The cardiomediastinal contours are unremarkable. No acute osseous or soft tissue abnormality. Bilateral metallic nipple ornamentation. Telemetry leads overlie the chest. IMPRESSION: 1. No acute cardiopulmonary abnormality. 2. Chronic hyperinflation. Electronically Signed   By: 09/05/2017 M.D.   On: 03/18/2021 23:55      TIMI Risk Score for Unstable Angina or Non-ST Elevation MI:   The patient's TIMI risk score is 3, which indicates a 13% risk of all cause mortality, new or recurrent myocardial infarction or need for urgent revascularization in the next 14 days.   Assessment and Plan:  Jorge Ford is a 40 y.o. male with no significant medical history who is being seen today for the evaluation of chest pain and elevated troponin.  On assessment, the patient's presentation does not  seem to support a diagnosis of acute STEMI given the lack of ST elevation on ECG and the atypical symptomatology. A bedside echocardiogram was further reassuring without any appreciable wall motion abnormalities or ventricular dysfunction. The patient's elevated troponin is more likely secondary to an alternative pathology. CT imaging would be helpful to rule out acute aortic syndrome, PE, an acute intra-abdominal process, or esophageal rupture from vomiting. We will continue to follow closely. Please do not hesitate to reach out to cardiology with any other questions or concerns.   This case was discussed with the on-call interventional cardiologist, Dr Eldridge Dace, who agrees with the above assessment and plan.   For questions or updates, please contact [facility] Please consult [email or web address] for contact info under   Signed, Rosario Jacks, MD  03/19/2021 12:39 AM

## 2021-03-19 NOTE — Progress Notes (Signed)
  Echocardiogram 2D Echocardiogram has been performed.  Jorge Ford F 03/19/2021, 8:41 AM

## 2021-03-19 NOTE — ED Notes (Signed)
April RN at bedside to witness correct doses and medications due to scanner in room being broken.

## 2021-03-19 NOTE — Progress Notes (Signed)
ANTICOAGULATION CONSULT NOTE - Follow Consult  Pharmacy Consult for heparin Indication: chest pain/ACS  No Known Allergies  Patient Measurements: Height: 6' (182.9 cm) Weight: 63.5 kg (140 lb) IBW/kg (Calculated) : 77.6  Vital Signs: Temp: 98.5 F (36.9 C) (03/20 1535) Temp Source: Oral (03/20 1535) BP: 126/89 (03/20 1455) Pulse Rate: 77 (03/20 1455)  Labs: Recent Labs    03/18/21 2322 03/19/21 0311 03/19/21 0630 03/19/21 1013 03/19/21 1453  HGB 16.1  --  15.2  --   --   HCT 46.3  --  44.8  --   --   PLT 250  --  265  --   --   HEPARINUNFRC  --   --   --   --  <0.10*  CREATININE 1.03  --  1.04  --   --   TROPONINIHS 3,231* 6,788* 6,729* 7,470*  --     Estimated Creatinine Clearance: 85.7 mL/min (by C-G formula based on SCr of 1.04 mg/dL).   Medical History: Past Medical History:  Diagnosis Date  . Back pain     Assessment: 40yo male c/o N/V followed by waxing and waning epigastric CP, troponin found to be elevated and rising (8127>5170), to begin heparin.  Currently on IV heparin at 750 units/hr. Initial heparin level is undetectable. No overt s/s of bleeding reported   Goal of Therapy:  Heparin level 0.3-0.7 units/ml Monitor platelets by anticoagulation protocol: Yes   Plan:  Heparin 1500 units bolus, then Increase IV heparin to 950 units/hr  F/u 6 hr HL Monitor daily HL, CBC and s/s of bleeding   Vinnie Level, PharmD., BCPS, BCCCP Clinical Pharmacist Please refer to [facility] for unit-specific pharmacist

## 2021-03-19 NOTE — Progress Notes (Signed)
ANTICOAGULATION CONSULT NOTE   Pharmacy Consult for heparin Indication: chest pain/ACS  No Known Allergies  Patient Measurements: Height: 6' (182.9 cm) Weight: 64.5 kg (142 lb 3.2 oz) IBW/kg (Calculated) : 77.6  Vital Signs: Temp: 99.1 F (37.3 C) (03/20 2020) Temp Source: Oral (03/20 2020) BP: 127/87 (03/20 2020) Pulse Rate: 74 (03/20 2020)  Labs: Recent Labs    03/18/21 2322 03/19/21 0311 03/19/21 0630 03/19/21 1013 03/19/21 1453 03/19/21 2234  HGB 16.1  --  15.2  --   --   --   HCT 46.3  --  44.8  --   --   --   PLT 250  --  265  --   --   --   HEPARINUNFRC  --   --   --   --  <0.10* <0.10*  CREATININE 1.03  --  1.04  --   --   --   TROPONINIHS 3,231* 6,788* 6,729* 7,470*  --   --     Estimated Creatinine Clearance: 87 mL/min (by C-G formula based on SCr of 1.04 mg/dL).   Assessment: 40yo male c/o N/V followed by waxing and waning epigastric CP, troponin found to be elevated and rising (3875>6433), to begin heparin.  Heparin level remains undetectable on gtt at 950 units/hr. No issues with line or bleeding reported per RN.  Goal of Therapy:  Heparin level 0.3-0.7 units/ml Monitor platelets by anticoagulation protocol: Yes   Plan:  Rebolus heparin 2000 units and increase heparin gtt to 1200 units/hr  F/u 6 hr heparin level  Christoper Fabian, PharmD, BCPS Please see amion for complete clinical pharmacist phone list 03/19/2021 11:23 PM

## 2021-03-20 ENCOUNTER — Encounter (HOSPITAL_COMMUNITY)
Admission: EM | Disposition: A | Discharge status: Home / Self Care | Payer: Self-pay | Source: Home / Self Care | Attending: Internal Medicine

## 2021-03-20 DIAGNOSIS — Z720 Tobacco use: Secondary | ICD-10-CM

## 2021-03-20 HISTORY — PX: LEFT HEART CATH AND CORONARY ANGIOGRAPHY: CATH118249

## 2021-03-20 LAB — CBC
HCT: 39.1 % (ref 39.0–52.0)
Hemoglobin: 13.8 g/dL (ref 13.0–17.0)
MCH: 31.2 pg (ref 26.0–34.0)
MCHC: 35.3 g/dL (ref 30.0–36.0)
MCV: 88.3 fL (ref 80.0–100.0)
Platelets: 175 10*3/uL (ref 150–400)
RBC: 4.43 MIL/uL (ref 4.22–5.81)
RDW: 13.1 % (ref 11.5–15.5)
WBC: 7.2 10*3/uL (ref 4.0–10.5)
nRBC: 0 % (ref 0.0–0.2)

## 2021-03-20 LAB — HEPARIN LEVEL (UNFRACTIONATED)
Heparin Unfractionated: 0.36 IU/mL (ref 0.30–0.70)
Heparin Unfractionated: 0.34 IU/mL (ref 0.30–0.70)

## 2021-03-20 SURGERY — LEFT HEART CATH AND CORONARY ANGIOGRAPHY
Anesthesia: LOCAL

## 2021-03-20 MED ORDER — SODIUM CHLORIDE 0.9% FLUSH
3.0000 mL | Freq: Two times a day (BID) | INTRAVENOUS | Status: DC
  Administered 2021-03-20: 3 mL via INTRAVENOUS

## 2021-03-20 MED ORDER — LIDOCAINE HCL (PF) 1 % IJ SOLN
INTRAMUSCULAR | Status: DC | PRN
  Administered 2021-03-20: 2 mL

## 2021-03-20 MED ORDER — SODIUM CHLORIDE 0.9 % WEIGHT BASED INFUSION
1.0000 mL/kg/h | INTRAVENOUS | Status: DC
  Administered 2021-03-20: 1 mL/kg/h via INTRAVENOUS

## 2021-03-20 MED ORDER — SODIUM CHLORIDE 0.9% FLUSH: 3.0000 mL | INTRAVENOUS | Status: DC | PRN

## 2021-03-20 MED ORDER — HEPARIN (PORCINE) IN NACL 1000-0.9 UT/500ML-% IV SOLN
INTRAVENOUS | Status: DC | PRN
  Administered 2021-03-20 (×2): 500 mL

## 2021-03-20 MED ORDER — SODIUM CHLORIDE 0.9% FLUSH
3.0000 mL | Freq: Two times a day (BID) | INTRAVENOUS | Status: DC
  Administered 2021-03-20 – 2021-03-21 (×2): 3 mL via INTRAVENOUS

## 2021-03-20 MED ORDER — HEPARIN SODIUM (PORCINE) 5000 UNIT/ML IJ SOLN
5000.0000 [IU] | Freq: Three times a day (TID) | INTRAMUSCULAR | Status: DC
  Administered 2021-03-20 – 2021-03-21 (×3): 5000 [IU] via SUBCUTANEOUS
  Filled 2021-03-20 (×3): qty 1

## 2021-03-20 MED ORDER — SODIUM CHLORIDE 0.9 % IV SOLN: INTRAVENOUS | Status: AC

## 2021-03-20 MED ORDER — VERAPAMIL HCL 2.5 MG/ML IV SOLN
INTRA_ARTERIAL | Status: DC | PRN
  Administered 2021-03-20: 10 mL via INTRA_ARTERIAL

## 2021-03-20 MED ORDER — FENTANYL CITRATE (PF) 100 MCG/2ML IJ SOLN
INTRAMUSCULAR | Status: DC | PRN
  Administered 2021-03-20: 25 ug via INTRAVENOUS

## 2021-03-20 MED ORDER — SODIUM CHLORIDE 0.9 % IV SOLN
250.0000 mL | INTRAVENOUS | Status: DC | PRN
Start: 2021-03-20 — End: 2021-03-22

## 2021-03-20 MED ORDER — SODIUM CHLORIDE 0.9 % WEIGHT BASED INFUSION
3.0000 mL/kg/h | INTRAVENOUS | Status: DC
  Administered 2021-03-20: 3 mL/kg/h via INTRAVENOUS

## 2021-03-20 MED ORDER — MIDAZOLAM HCL 2 MG/2ML IJ SOLN
INTRAMUSCULAR | Status: DC | PRN
  Administered 2021-03-20: 1 mg via INTRAVENOUS

## 2021-03-20 MED ORDER — HYDRALAZINE HCL 20 MG/ML IJ SOLN: 10.0000 mg | INTRAMUSCULAR | Status: AC | PRN

## 2021-03-20 MED ORDER — SODIUM CHLORIDE 0.9 % IV SOLN: 250.0000 mL | INTRAVENOUS | Status: DC | PRN

## 2021-03-20 MED ORDER — VERAPAMIL HCL 2.5 MG/ML IV SOLN
INTRAVENOUS | Status: AC
  Filled 2021-03-20: qty 2

## 2021-03-20 MED ORDER — ACETAMINOPHEN 325 MG PO TABS: 650.0000 mg | ORAL_TABLET | ORAL | Status: DC | PRN

## 2021-03-20 MED ORDER — IOHEXOL 350 MG/ML SOLN
INTRAVENOUS | Status: DC | PRN
  Administered 2021-03-20: 35 mL via INTRA_ARTERIAL

## 2021-03-20 MED ORDER — HEPARIN SODIUM (PORCINE) 1000 UNIT/ML IJ SOLN
INTRAMUSCULAR | Status: DC | PRN
  Administered 2021-03-20: 3000 [IU] via INTRAVENOUS

## 2021-03-20 MED ORDER — MORPHINE SULFATE (PF) 2 MG/ML IV SOLN
2.0000 mg | INTRAVENOUS | Status: DC | PRN
Start: 2021-03-20 — End: 2021-03-22

## 2021-03-20 MED ORDER — NITROGLYCERIN 1 MG/10 ML FOR IR/CATH LAB
INTRA_ARTERIAL | Status: AC
  Filled 2021-03-20: qty 10

## 2021-03-20 MED ORDER — ATORVASTATIN CALCIUM 80 MG PO TABS
80.0000 mg | ORAL_TABLET | Freq: Every day | ORAL | Status: DC
  Administered 2021-03-20: 80 mg via ORAL
  Filled 2021-03-20: qty 1

## 2021-03-20 MED ORDER — MIDAZOLAM HCL 2 MG/2ML IJ SOLN
INTRAMUSCULAR | Status: AC
  Filled 2021-03-20: qty 2

## 2021-03-20 MED ORDER — FENTANYL CITRATE (PF) 100 MCG/2ML IJ SOLN
INTRAMUSCULAR | Status: AC
  Filled 2021-03-20: qty 2

## 2021-03-20 MED ORDER — METOPROLOL TARTRATE 12.5 MG HALF TABLET
12.5000 mg | ORAL_TABLET | Freq: Two times a day (BID) | ORAL | Status: DC
  Administered 2021-03-20 – 2021-03-21 (×3): 12.5 mg via ORAL
  Filled 2021-03-20 (×3): qty 1

## 2021-03-20 MED ORDER — LABETALOL HCL 5 MG/ML IV SOLN: 10.0000 mg | INTRAVENOUS | Status: AC | PRN

## 2021-03-20 MED ORDER — ONDANSETRON HCL 4 MG/2ML IJ SOLN: 4.0000 mg | Freq: Four times a day (QID) | INTRAMUSCULAR | Status: DC | PRN

## 2021-03-20 MED ORDER — HEPARIN (PORCINE) IN NACL 1000-0.9 UT/500ML-% IV SOLN
INTRAVENOUS | Status: AC
  Filled 2021-03-20: qty 500

## 2021-03-20 MED ORDER — HEPARIN SODIUM (PORCINE) 1000 UNIT/ML IJ SOLN
INTRAMUSCULAR | Status: AC
  Filled 2021-03-20: qty 1

## 2021-03-20 MED ORDER — NITROGLYCERIN 0.4 MG SL SUBL: 0.4000 mg | SUBLINGUAL_TABLET | SUBLINGUAL | Status: DC | PRN

## 2021-03-20 MED ORDER — LIDOCAINE HCL (PF) 1 % IJ SOLN
INTRAMUSCULAR | Status: AC
  Filled 2021-03-20: qty 30

## 2021-03-20 SURGICAL SUPPLY — 10 items
CATH OPTITORQUE TIG 4.0 5F (CATHETERS) ×1 IMPLANT
DEVICE RAD COMP TR BAND LRG (VASCULAR PRODUCTS) ×1 IMPLANT
GLIDESHEATH SLEND A-KIT 6F 22G (SHEATH) ×1 IMPLANT
GUIDEWIRE INQWIRE 1.5J.035X260 (WIRE) IMPLANT
INQWIRE 1.5J .035X260CM (WIRE) ×2
KIT HEART LEFT (KITS) ×2 IMPLANT
PACK CARDIAC CATHETERIZATION (CUSTOM PROCEDURE TRAY) ×2 IMPLANT
TRANSDUCER W/STOPCOCK (MISCELLANEOUS) ×2 IMPLANT
TUBING CIL FLEX 10 FLL-RA (TUBING) ×2 IMPLANT
WIRE HI TORQ VERSACORE-J 145CM (WIRE) ×1 IMPLANT

## 2021-03-20 NOTE — Progress Notes (Signed)
   Coronary angiography results have been reviewed.  No obstructive disease identified.  Plan to pursue cardiac MRI to exclude myocarditis given significant but relatively flat enzyme rise.

## 2021-03-20 NOTE — Interval H&P Note (Signed)
Cath Lab Visit (complete for each Cath Lab visit)  Clinical Evaluation Leading to the Procedure:   ACS: Yes.    Non-ACS:    Anginal Classification: CCS II  Anti-ischemic medical therapy: No Therapy  Non-Invasive Test Results: No non-invasive testing performed  Prior CABG: No previous CABG      History and Physical Interval Note:  03/20/2021 2:42 PM  Jorge Ford  has presented today for surgery, with the diagnosis of nstemi.  The various methods of treatment have been discussed with the patient and family. After consideration of risks, benefits and other options for treatment, the patient has consented to  Procedure(s): LEFT HEART CATH AND CORONARY ANGIOGRAPHY (N/A) as a surgical intervention.  The patient's history has been reviewed, patient examined, no change in status, stable for surgery.  I have reviewed the patient's chart and labs.  Questions were answered to the patient's satisfaction.     Nanetta Batty

## 2021-03-20 NOTE — Progress Notes (Signed)
PROGRESS NOTE    Jorge Ford  ZOX:[id number] DOB: [birth year] DOA: 03/18/2021 PCP: Toma Deiters, MD    Brief Narrative:  40 yo male with alcohol use disorder, no significant medical problems went to [facility] with chest pain and found to have elevated troponins without ischemic changes on EKG and transferred to [facility], ER.  Since then admitted to the hospital with heparin infusion, cardiac cath plans.   Assessment & Plan:   Principal Problem:   NSTEMI (non-ST elevated myocardial infarction) (HCC) Active Problems:   Nausea & vomiting   Leukocytosis   Metabolic acidosis   Tobacco use  Elevated troponins: Troponin [phone].  Remains flat since then.  Chest pain-free since admission.  Hemodynamically stable. EKG nonischemic. Cardiac cath because of significant rise in troponin with normal EF, normal coronaries. Cardiac MRI today. Remains on aspirin.  Statin and beta-blockers. Followed by cardiology.  Discontinue heparin drip.  Leukocytosis: Reactive.  Normalized.  Alcohol use: Counseled to quit.  He is receptive to quit.  On CIWA scale and multivitamins.  So far is stable.  No withdrawals.   DVT prophylaxis: heparin injection 5,000 Units Start: 03/20/21 2200   Code Status: Full code Family Communication: None Disposition Plan: Status is: Inpatient  Remains inpatient appropriate because:Inpatient level of care appropriate due to severity of illness   Dispo: The patient is from: Home              Anticipated d/c is to: Home              Patient currently is not medically stable to d/c.   Difficult to place patient No         Consultants:   Cardiology  Procedures:   Cardiac cath 3/21, normal coronaries.  Normal ejection fraction.  Antimicrobials:   None   Subjective: Patient was seen in the morning rounds.  Looking forward to go to cardiac cath.  He has no recurrence of symptoms since admission the night before.  Objective: Vitals:    03/20/21 1503 03/20/21 1508 03/20/21 1513 03/20/21 1530  BP: 126/87 121/84  128/86  Pulse: 66 66 81 (!) 55  Resp: Temp:    98.2 F (36.8 C)  TempSrc:    Oral  SpO2: 100% 100%  96%  Weight:      Height:        Intake/Output Summary (Last 24 hours) at 03/20/2021 1639 Last data filed at 03/20/2021 0539 Gross per 24 hour  Intake 214.42 ml  Output -  Net 214.42 ml   Filed Weights   03/18/21 2312 03/19/21 1547 03/20/21 0509  Weight: 63.5 kg 64.5 kg 64.7 kg    Examination:  General exam: Appears calm and comfortable  Respiratory system: Clear to auscultation. Respiratory effort normal. Cardiovascular system: S1 & S2 heard, RRR. No JVD, murmurs, rubs, gallops or clicks. No pedal edema. Gastrointestinal system: Abdomen is nondistended, soft and nontender. No organomegaly or masses felt. Normal bowel sounds heard. Central nervous system: Alert and oriented. No focal neurological deficits. Extremities: Symmetric 5 x 5 power. Skin: No rashes, lesions or ulcers Psychiatry: Judgement and insight appear normal. Mood & affect appropriate.     Data Reviewed: I have personally reviewed following labs and imaging studies  CBC: Recent Labs  Lab 03/18/21 2322 03/19/21 0630 03/20/21 0655  WBC 17.0* 18.4* 7.2  NEUTROABS 14.8*  --   --   HGB 16.1 15.2 13.8  HCT 46.3 44.8 39.1  MCV 88.9 90.3  88.3  PLT 250 265 175   Basic Metabolic Panel: Recent Labs  Lab 03/18/21 2322 03/19/21 0630  NA 136 134*  K 3.9 4.0  CL 106 105  CO2 14* 17*  GLUCOSE 90 99  BUN 18 17  CREATININE 1.03 1.04  CALCIUM 9.2 9.2  MG  --  2.0  PHOS  --  5.0*   GFR: Estimated Creatinine Clearance: 87.3 mL/min (by C-G formula based on SCr of 1.04 mg/dL). Liver Function Tests: Recent Labs  Lab 03/18/21 2322  AST 59*  ALT 33  ALKPHOS 47  BILITOT 1.8*  PROT 7.6  ALBUMIN 4.5   Recent Labs  Lab 03/18/21 2322  LIPASE 39   No results for input(s): AMMONIA in the last 168 hours. Coagulation  Profile: No results for input(s): INR, PROTIME in the last 168 hours. Cardiac Enzymes: No results for input(s): CKTOTAL, CKMB, CKMBINDEX, TROPONINI in the last 168 hours. BNP (last 3 results) No results for input(s): PROBNP in the last 8760 hours. HbA1C: No results for input(s): HGBA1C in the last 72 hours. CBG: No results for input(s): GLUCAP in the last 168 hours. Lipid Profile: No results for input(s): CHOL, HDL, LDLCALC, TRIG, CHOLHDL, LDLDIRECT in the last 72 hours. Thyroid Function Tests: No results for input(s): TSH, T4TOTAL, FREET4, T3FREE, THYROIDAB in the last 72 hours. Anemia Panel: No results for input(s): VITAMINB12, FOLATE, FERRITIN, TIBC, IRON, RETICCTPCT in the last 72 hours. Sepsis Labs: Recent Labs  Lab 03/19/21 0630  PROCALCITON 0.10  LATICACIDVEN 1.1    Recent Results (from the past 240 hour(s))  Resp Panel by RT-PCR (Flu A&B, Covid) Nasopharyngeal Swab     Status: None   Collection Time: 03/18/21 11:32 PM   Specimen: Nasopharyngeal Swab; Nasopharyngeal(NP) swabs in vial transport medium  Result Value Ref Range Status   SARS Coronavirus 2 by RT PCR NEGATIVE NEGATIVE Final    Comment: (NOTE) SARS-CoV-2 target nucleic acids are NOT DETECTED.  The SARS-CoV-2 RNA is generally detectable in upper respiratory specimens during the acute phase of infection. The lowest concentration of SARS-CoV-2 viral copies this assay can detect is 138 copies/mL. A negative result does not preclude SARS-Cov-2 infection and should not be used as the sole basis for treatment or other patient management decisions. A negative result may occur with  improper specimen collection/handling, submission of specimen other than nasopharyngeal swab, presence of viral mutation(s) within the areas targeted by this assay, and inadequate number of viral copies(<138 copies/mL). A negative result must be combined with clinical observations, patient history, and epidemiological information. The  expected result is Negative.  Fact Sheet for Patients:  BloggerCourse.com  Fact Sheet for Healthcare Providers:  SeriousBroker.it  This test is no t yet approved or cleared by the [place] FDA and  has been authorized for detection and/or diagnosis of SARS-CoV-2 by FDA under an Emergency Use Authorization (EUA). This EUA will remain  in effect (meaning this test can be used) for the duration of the COVID-19 declaration under Section 564(b)(1) of the Act, 21 U.S.C.section 360bbb-3(b)(1), unless the authorization is terminated  or revoked sooner.       Influenza A by PCR NEGATIVE NEGATIVE Final   Influenza B by PCR NEGATIVE NEGATIVE Final    Comment: (NOTE) The Xpert Xpress SARS-CoV-2/FLU/RSV plus assay is intended as an aid in the diagnosis of influenza from Nasopharyngeal swab specimens and should not be used as a sole basis for treatment. Nasal washings and aspirates are unacceptable for Xpert Xpress SARS-CoV-2/FLU/RSV testing.  Fact Sheet for Patients: BloggerCourse.comhttps://www.fda.gov/media/152166/download  Fact Sheet for Healthcare Providers: SeriousBroker.ithttps://www.fda.gov/media/152162/download  This test is not yet approved or cleared by the [place] FDA and has been authorized for detection and/or diagnosis of SARS-CoV-2 by FDA under an Emergency Use Authorization (EUA). This EUA will remain in effect (meaning this test can be used) for the duration of the COVID-19 declaration under Section 564(b)(1) of the Act, 21 U.S.C. section 360bbb-3(b)(1), unless the authorization is terminated or revoked.  Performed at [facility], [address] [place], [place] [address]   Culture, blood (routine x 2)     Status: None (Preliminary result)   Collection Time: 03/19/21  6:01 AM   Specimen: BLOOD LEFT WRIST  Result Value Ref Range Status   Specimen Description BLOOD LEFT WRIST  Final   Special Requests   Final    BOTTLES DRAWN  AEROBIC AND ANAEROBIC Blood Culture adequate volume   Culture   Final    NO GROWTH < 24 HOURS Performed at [facility], [address] [place], [place] [address]    Report Status PENDING  Incomplete  Culture, blood (routine x 2)     Status: None (Preliminary result)   Collection Time: 03/19/21  6:06 AM   Specimen: BLOOD  Result Value Ref Range Status   Specimen Description BLOOD RIGHT ANTECUBITAL  Final   Special Requests   Final    BOTTLES DRAWN AEROBIC AND ANAEROBIC Blood Culture results may not be optimal due to an inadequate volume of blood received in culture bottles   Culture   Final    NO GROWTH < 24 HOURS Performed at [facility], [address] [place], [place] [address]    Report Status PENDING  Incomplete         Radiology Studies: CARDIAC CATHETERIZATION  Result Date: 03/20/2021 Jorge Kaufmanhomas Arias is a 40 y.o. male  [id number] LOCATION:  FACILITY: [facility] PHYSICIAN: Nanetta BattyJonathan Berry, M.D. 01/05/1981 DATE OF PROCEDURE:  03/20/2021 DATE OF DISCHARGE: CARDIAC CATHETERIZATION History obtained from chart review.40 y.o. male with PMH of alcohol abuse who [facility] with [facility] pain and found to have a NSTEMI.  His troponins rose to 7000 level.  His 2D echo was normal.  He said no recurrent chest pain.  He presents now for diagnostic coronary angiography to rule out an ischemic etiology.   Jorge Ford has normal coronary arteries and an LVEDP of 1 suggesting that he is "dry".  There is no "culprit lesion" to explain his non-STEMI.  He may have had coronary vasospasm.  The sheath was removed and a TR band was placed on the right wrist to achieve patent hemostasis.  The patient left lab in stable condition. Nanetta BattyJonathan Berry. MD, [facility] 03/20/2021 3:17 PM   DG Chest Port 1 View  Result Date: 03/18/2021 CLINICAL DATA:  Chest pain, nausea and vomiting since 7 p.m. EXAM: PORTABLE CHEST 1 VIEW COMPARISON:  Radiograph 07/05/2017 FINDINGS: Chronic hyperinflation. No  consolidation, features of edema, pneumothorax, or effusion. Pulmonary vascularity is normally distributed. The cardiomediastinal contours are unremarkable. No acute osseous or soft tissue abnormality. Bilateral metallic nipple ornamentation. Telemetry leads overlie the chest. IMPRESSION: 1. No acute cardiopulmonary abnormality. 2. Chronic hyperinflation. Electronically Signed   By: Kreg ShropshirePrice  DeHay M.D.   On: 03/18/2021 23:55   ECHOCARDIOGRAM COMPLETE  Result Date: 03/19/2021    ECHOCARDIOGRAM REPORT   Patient Name:   Jorge FusHOMAS Ford Date of Exam: 03/19/2021 Medical Rec #:  [id number]  Height:       72.0 in Accession #:    [id number]     Weight:       140.0 lb Date of Birth:  1981-01-23      BSA:          1.831 m Patient Age:    39 years       BP:           124/82 mmHg Patient Gender: M              HR:           92 bpm. Exam Location:  Inpatient Procedure: 2D Echo, Cardiac Doppler and Color Doppler Indications:    R07.9* Chest pain, unspecified  History:        Patient has no prior history of Echocardiogram examinations.                 Abnormal ECG; Risk Factors:Current Smoker and Family History of                 Coronary Artery Disease. ETOH abuse. Nausea and vomitting.  Sonographer:    Roosvelt Maser RDCS Referring Phys: [id number] VASUNDHRA RATHORE IMPRESSIONS  1. Left ventricular ejection fraction, by estimation, is 60 to 65%. The left ventricle has normal function. The left ventricle has no regional wall motion abnormalities. Left ventricular diastolic parameters were normal.  2. Right ventricular systolic function is normal. The right ventricular size is normal.  3. The mitral valve is normal in structure. No evidence of mitral valve regurgitation. No evidence of mitral stenosis.  4. The aortic valve is normal in structure. Aortic valve regurgitation is not visualized. No aortic stenosis is present.  5. The inferior vena cava is normal in size with greater than 50% respiratory variability, suggesting right  atrial pressure of 3 mmHg. Conclusion(s)/Recommendation(s): Normal biventricular function without evidence of hemodynamically significant valvular heart disease. FINDINGS  Left Ventricle: Left ventricular ejection fraction, by estimation, is 60 to 65%. The left ventricle has normal function. The left ventricle has no regional wall motion abnormalities. The left ventricular internal cavity size was normal in size. There is  no left ventricular hypertrophy. Left ventricular diastolic parameters were normal. Right Ventricle: The right ventricular size is normal. No increase in right ventricular wall thickness. Right ventricular systolic function is normal. Left Atrium: Left atrial size was normal in size. Right Atrium: Right atrial size was normal in size. Pericardium: There is no evidence of pericardial effusion. Mitral Valve: The mitral valve is normal in structure. No evidence of mitral valve regurgitation. No evidence of mitral valve stenosis. Tricuspid Valve: The tricuspid valve is normal in structure. Tricuspid valve regurgitation is not demonstrated. No evidence of tricuspid stenosis. Aortic Valve: The aortic valve is normal in structure. Aortic valve regurgitation is not visualized. No aortic stenosis is present. Aortic valve mean gradient measures 5.0 mmHg. Aortic valve peak gradient measures 8.4 mmHg. Aortic valve area, by VTI measures 1.84 cm. Pulmonic Valve: The pulmonic valve was normal in structure. Pulmonic valve regurgitation is not visualized. No evidence of pulmonic stenosis. Aorta: The aortic root is normal in size and structure. Venous: The inferior vena cava is normal in size with greater than 50% respiratory variability, suggesting right atrial pressure of 3 mmHg. IAS/Shunts: No atrial level shunt detected by color flow Doppler.  LEFT VENTRICLE PLAX 2D LVIDd:         4.00 cm  Diastology LVIDs:         3.10  cm  LV e' medial:    13.80 cm/s LV PW:         1.00 cm  LV E/e' medial:  8.5 LV IVS:         0.90 cm  LV e' lateral:   17.40 cm/s LVOT diam:     2.00 cm  LV E/e' lateral: 6.7 LV SV:         49 LV SV Index:   27 LVOT Area:     3.14 cm  RIGHT VENTRICLE          IVC RV Basal diam:  2.90 cm  IVC diam: 1.40 cm LEFT ATRIUM           Index       RIGHT ATRIUM           Index LA diam:      3.10 cm 1.69 cm/m  RA Area:     13.40 cm LA Vol (A2C): 30.6 ml 16.71 ml/m RA Volume:   32.50 ml  17.75 ml/m LA Vol (A4C): 68.0 ml 37.14 ml/m  AORTIC VALVE AV Area (Vmax):    2.19 cm AV Area (Vmean):   1.95 cm AV Area (VTI):     1.84 cm AV Vmax:           145.00 cm/s AV Vmean:          100.000 cm/s AV VTI:            0.268 m AV Peak Grad:      8.4 mmHg AV Mean Grad:      5.0 mmHg LVOT Vmax:         101.00 cm/s LVOT Vmean:        62.100 cm/s LVOT VTI:          0.157 m LVOT/AV VTI ratio: 0.59  AORTA Ao Root diam: 3.10 cm MITRAL VALVE MV Area (PHT): 3.42 cm     SHUNTS MV Decel Time: 222 msec     Systemic VTI:  0.16 m MV E velocity: 117.00 cm/s  Systemic Diam: 2.00 cm MV A velocity: 77.60 cm/s MV E/A ratio:  1.51 Tobias Alexander MD Electronically signed by Tobias Alexander MD Signature Date/Time: 03/19/2021/12:24:38 PM    Final    CT Angio Chest/Abd/Pel for Dissection W and/or Wo Contrast  Result Date: 03/19/2021 CLINICAL DATA:  Chest pain, elevated troponin, heavy drinking last night, now with nausea, vomiting and epigastric/chest pain EXAM: CT ANGIOGRAPHY CHEST, ABDOMEN AND PELVIS TECHNIQUE: Non-contrast CT of the chest was initially obtained. Multidetector CT imaging through the chest, abdomen and pelvis was performed using the standard protocol during bolus administration of intravenous contrast. Multiplanar reconstructed images and MIPs were obtained and reviewed to evaluate the vascular anatomy. CONTRAST:  OMNIPAQUE IOHEXOL 350 MG/ML SOLN COMPARISON:  Chest radiograph 03/18/2021 FINDINGS: CTA CHEST FINDINGS Cardiovascular: A noncontrast CT of the chest was performed initially. No hyperdense mural thickening to  suggest intramural hematoma. Postcontrast administration there is satisfactory preferential opacification of the thoracic aorta. Mild motion artifact seen at the level of the aortic root, ascending aorta and proximal descending arch. Accounting for motion artifact, no acute luminal abnormality is seen. No periaortic stranding or hemorrhage. Normal 3 vessel branching of the aortic arch. Proximal great vessels are unremarkable. Central pulmonary arteries are normal caliber. No large central or lobar filling defects within limitations non tailored examination of the pulmonary arteries. Normal heart size. No pericardial effusion. Mediastinum/Nodes: No mediastinal fluid or gas. Normal thyroid gland and thoracic  inlet. No acute abnormality of the trachea or esophagus. No worrisome mediastinal, hilar or axillary adenopathy. Lungs/Pleura: No consolidation, features of edema, pneumothorax, or effusion. Mild hyperinflation. Tiny benign calcified granuloma seen in the anterior right upper lobe (7/69). No suspicious pulmonary nodules or masses. Musculoskeletal: 0 mild discogenic changes and multilevel Schmorl's node formations present in the spine. Degenerative changes most pronounced at the T11-12 disc space and in the lower cervical levels. Mild arthrosis in the bilateral shoulders as well. No acute or worrisome chest wall abnormalities. Review of the MIP images confirms the above findings. CTA ABDOMEN AND PELVIS FINDINGS VASCULAR Aorta: Normal caliber aorta without aneurysm, dissection, vasculitis or significant stenosis. Celiac: Patent without evidence of aneurysm, dissection, vasculitis or significant stenosis. SMA: Patent without evidence of aneurysm, dissection, vasculitis or significant stenosis. Renals: Single renal arteries bilaterally. Both renal arteries are patent without evidence of aneurysm, dissection, vasculitis, fibromuscular dysplasia or significant stenosis. IMA: Patent without acute luminal abnormality,  aneurysm or visible dissection. Inflow: Inflow vessels and proximal outflow vasculature is widely patent bilaterally. No evidence of aneurysm, dissection or vasculitis. Veins: No obvious venous abnormality within the limitations of this arterial phase study. Review of the MIP images confirms the above findings. NON-VASCULAR Hepatobiliary: While some of the hypoattenuation of liver parenchyma may be related to arterial phase imaging, sparing along the gallbladder fossa does suggest some degree of hepatic steatosis. Smooth liver surface contour. No concerning liver lesion. Normal gallbladder and biliary tree without visible calcified gallstone. Pancreas: No pancreatic ductal dilatation or surrounding inflammatory changes. Spleen: Heterogeneous splenic enhancement is typical for the arterial phase of imaging. Normal in size. No concerning splenic lesions. Adrenals/Urinary Tract: Normal adrenal glands. Kidneys are normally located with symmetric enhancement. No suspicious renal lesion, urolithiasis or hydronephrosis. Urinary bladder is largely decompressed at the time of exam and therefore poorly evaluated by CT imaging. Mild bladder wall thickening may be related to underdistention. No other gross bladder abnormality. Stomach/Bowel: Distal esophagus, stomach and duodenum are unremarkable. No small bowel thickening or dilatation. No evidence of obstruction. Normal appendix in the right lower quadrant. Colon largely decompressed at the time of exam. No significant colonic thickening or dilatation accounting for underdistention. Limited assessment of the mesentery given a paucity of intraperitoneal fat. Lymphatic: No suspicious or enlarged lymph nodes in the included lymphatic chains. Reproductive: The prostate and seminal vesicles are unremarkable. No acute abnormality of the included external genitalia. Other: No abdominopelvic free fluid or free gas. No bowel containing hernias. Musculoskeletal: No acute osseous  abnormality or suspicious osseous lesion. Minimal discogenic changes. Review of the MIP images confirms the above findings. IMPRESSION: 1. No evidence of acute aortic syndrome. 2. Mild circumferential bladder wall thickening, possibly related to underdistention. Correlate with urinalysis to exclude cystitis. 3. No other acute intrathoracic or intra-abdominal process. 4. Hepatic steatosis. Electronically Signed   By: Kreg Shropshire M.D.   On: 03/19/2021 02:30        Scheduled Meds: . aspirin EC  81 mg Oral Daily  . atorvastatin  80 mg Oral Daily  . folic acid  1 mg Oral Daily  . heparin injection (subcutaneous)  5,000 Units Subcutaneous Q8H  . metoprolol tartrate  12.5 mg Oral BID  . multivitamin with minerals  1 tablet Oral Daily  . nicotine  21 mg Transdermal Daily  . sodium chloride flush  3 mL Intravenous Q12H  . thiamine  100 mg Oral Daily   Or  . thiamine  100 mg Intravenous Daily   Continuous  Infusions: . sodium chloride    . sodium chloride    . promethazine (PHENERGAN) injection       LOS: 1 day    Time spent: 25 minutes    Dorcas Carrow, MD [facility] Pager [phone]

## 2021-03-20 NOTE — Progress Notes (Addendum)
ANTICOAGULATION CONSULT NOTE - Follow Consult  Pharmacy Consult for heparin Indication: chest pain/ACS  No Known Allergies  Patient Measurements: Height: 6' (182.9 cm) Weight: 64.7 kg (142 lb 10.2 oz) IBW/kg (Calculated) : 77.6  Vital Signs: Temp: 98.2 F (36.8 C) (03/21 0509) Temp Source: Oral (03/21 0509) BP: 117/81 (03/21 0509) Pulse Rate: 60 (03/21 0509)  Labs: Recent Labs    03/18/21 2322 03/19/21 0311 03/19/21 0630 03/19/21 1013 03/19/21 1453 03/19/21 2234 03/20/21 0655  HGB 16.1  --  15.2  --   --   --  13.8  HCT 46.3  --  44.8  --   --   --  39.1  PLT 250  --  265  --   --   --  175  HEPARINUNFRC  --   --   --   --  <0.10* <0.10* 0.36  CREATININE 1.03  --  1.04  --   --   --   --   TROPONINIHS 3,231* 6,788* 6,729* 7,470*  --   --   --     Estimated Creatinine Clearance: 87.3 mL/min (by C-G formula based on SCr of 1.04 mg/dL).   Medical History: Past Medical History:  Diagnosis Date  . Back pain     Assessment: 40yo male c/o N/V followed by waxing and waning epigastric CP, troponin found to be elevated and rising on heparin.  -Currently on IV heparin at 750 units/hr. Heparin level is at goal, CBC stable  Goal of Therapy:  Heparin level 0.3-0.7 units/ml Monitor platelets by anticoagulation protocol: Yes   Plan:  -Continue heparin at 750 units/hr -recheck heparin level today -Daily heparin level and CBC  Harland German, PharmD Clinical Pharmacist **Pharmacist phone directory can now be found on [email or web address] ([email or web address]).  Listed under [facility].  Addendum -heparin level confirmed at goal  Plan -No heparin changes needed -Will follow plans post cath  Harland German, PharmD Clinical Pharmacist **Pharmacist phone directory can now be found on [email or web address] (PW [email or web address]).  Listed under [facility].

## 2021-03-20 NOTE — H&P (View-Only) (Signed)
The patient has been seen in conjunction with Geoffry Paradise, NP. All aspects of care have been considered and discussed. The patient has been personally interviewed, examined, and all clinical data has been reviewed.   Clinical presentation is that of chest pain after nausea and vomiting, every preceding alcohol intake, tachycardia, and elevated troponin I.  The patient presented with an accelerated idioventricular rhythm which could represent a post perfusion phenomenon.  Surprisingly, echocardiogram is unremarkable.  Consolidating diagnosis is uncertain, and could include myocarditis, ischemia due to coronary disease (SCAD versus transient vessel occlusion).  Coronary angiography should be performed to exclude anatomic abnormality.  If coronaries are clean, consider cardiac MRI.    Progress Note  Patient Name: Jorge Ford Date of Encounter: 03/20/2021  [facility] Cardiologist: No primary care provider on file.   Subjective   No chest pain this morning. Feeling much better.   Inpatient Medications    Scheduled Meds: . aspirin EC  81 mg Oral Daily  . folic acid  1 mg Oral Daily  . multivitamin with minerals  1 tablet Oral Daily  . nicotine  21 mg Transdermal Daily  . thiamine  100 mg Oral Daily   Or  . thiamine  100 mg Intravenous Daily   Continuous Infusions: . heparin 1,200 Units/hr (03/20/21 0539)  . promethazine (PHENERGAN) injection     PRN Meds: acetaminophen, LORazepam **OR** LORazepam, promethazine (PHENERGAN) injection   Vital Signs    Vitals:   03/19/21 1535 03/19/21 1547 03/19/21 2020 03/20/21 0509  BP:  130/81 127/87 117/81  Pulse:  94 74 60  Resp:  18 16 18   Temp:  99.1 F (37.3 C) 99.1 F (37.3 C) 98.2 F (36.8 C)  TempSrc: Oral Oral Oral Oral  SpO2:  99% 100% 98%  Weight:  64.5 kg  64.7 kg  Height:  6' (1.829 m)      Intake/Output Summary (Last 24 hours) at 03/20/2021 0912 Last data filed at 03/20/2021 0539 Gross per 24 hour   Intake 214.42 ml  Output --  Net 214.42 ml   Last 3 Weights 03/20/2021 03/19/2021 03/18/2021  Weight (lbs) 142 lb 10.2 oz 142 lb 3.2 oz 140 lb  Weight (kg) 64.7 kg 64.501 kg 63.504 kg      Telemetry    SR, few episodes of ST - Personally Reviewed  ECG    SR with biatrial enlargement, poor R wave progression  - Personally Reviewed  Physical Exam   GEN: No acute distress.   Neck: No JVD Cardiac: RRR, no murmurs, rubs, or gallops.  Respiratory: Clear to auscultation bilaterally. GI: Soft, nontender, non-distended  MS: No edema; No deformity. Neuro:  Nonfocal  Psych: Normal affect   Labs    High Sensitivity Troponin:   Recent Labs  Lab 03/18/21 2322 03/19/21 0311 03/19/21 0630 03/19/21 1013  TROPONINIHS 3,231* 6,788* 6,729* 7,470*      Chemistry Recent Labs  Lab 03/18/21 2322 03/19/21 0630  NA 136 134*  K 3.9 4.0  CL 106 105  CO2 14* 17*  GLUCOSE 90 99  BUN 18 17  CREATININE 1.03 1.04  CALCIUM 9.2 9.2  PROT 7.6  --   ALBUMIN 4.5  --   AST 59*  --   ALT 33  --   ALKPHOS 47  --   BILITOT 1.8*  --   GFRNONAA >60 >60  ANIONGAP 16* 12     Hematology Recent Labs  Lab 03/18/21 2322 03/19/21 0630 03/20/21 0655  WBC  17.0* 18.4* 7.2  RBC 5.21 4.96 4.43  HGB 16.1 15.2 13.8  HCT 46.3 44.8 39.1  MCV 88.9 90.3 88.3  MCH 30.9 30.6 31.2  MCHC 34.8 33.9 35.3  RDW 13.2 13.3 13.1  PLT 250 265 175    BNPNo results for input(s): BNP, PROBNP in the last 168 hours.   DDimer No results for input(s): DDIMER in the last 168 hours.   Radiology    DG Chest Port 1 View  Result Date: 03/18/2021 CLINICAL DATA:  Chest pain, nausea and vomiting since 7 p.m. EXAM: PORTABLE CHEST 1 VIEW COMPARISON:  Radiograph 07/05/2017 FINDINGS: Chronic hyperinflation. No consolidation, features of edema, pneumothorax, or effusion. Pulmonary vascularity is normally distributed. The cardiomediastinal contours are unremarkable. No acute osseous or soft tissue abnormality. Bilateral  metallic nipple ornamentation. Telemetry leads overlie the chest. IMPRESSION: 1. No acute cardiopulmonary abnormality. 2. Chronic hyperinflation. Electronically Signed   By: Kreg ShropshirePrice  DeHay M.D.   On: 03/18/2021 23:55   ECHOCARDIOGRAM COMPLETE  Result Date: 03/19/2021    ECHOCARDIOGRAM REPORT   Patient Name:   Jorge Ford Date of Exam: 03/19/2021 Medical Rec #:  [id number]      Height:       72.0 in Accession #:    [id number]     Weight:       140.0 lb Date of Birth:  12/15/1981      BSA:          1.831 m Patient Age:    39 years       BP:           124/82 mmHg Patient Gender: M              HR:           92 bpm. Exam Location:  Inpatient Procedure: 2D Echo, Cardiac Doppler and Color Doppler Indications:    R07.9* Chest pain, unspecified  History:        Patient has no prior history of Echocardiogram examinations.                 Abnormal ECG; Risk Factors:Current Smoker and Family History of                 Coronary Artery Disease. ETOH abuse. Nausea and vomitting.  Sonographer:    Roosvelt Maserachel Lane RDCS Referring Phys: [id number] VASUNDHRA RATHORE IMPRESSIONS  1. Left ventricular ejection fraction, by estimation, is 60 to 65%. The left ventricle has normal function. The left ventricle has no regional wall motion abnormalities. Left ventricular diastolic parameters were normal.  2. Right ventricular systolic function is normal. The right ventricular size is normal.  3. The mitral valve is normal in structure. No evidence of mitral valve regurgitation. No evidence of mitral stenosis.  4. The aortic valve is normal in structure. Aortic valve regurgitation is not visualized. No aortic stenosis is present.  5. The inferior vena cava is normal in size with greater than 50% respiratory variability, suggesting right atrial pressure of 3 mmHg. Conclusion(s)/Recommendation(s): Normal biventricular function without evidence of hemodynamically significant valvular heart disease. FINDINGS  Left Ventricle: Left ventricular ejection  fraction, by estimation, is 60 to 65%. The left ventricle has normal function. The left ventricle has no regional wall motion abnormalities. The left ventricular internal cavity size was normal in size. There is  no left ventricular hypertrophy. Left ventricular diastolic parameters were normal. Right Ventricle: The right ventricular size is normal. No increase in right ventricular wall thickness. Right ventricular systolic function  is normal. Left Atrium: Left atrial size was normal in size. Right Atrium: Right atrial size was normal in size. Pericardium: There is no evidence of pericardial effusion. Mitral Valve: The mitral valve is normal in structure. No evidence of mitral valve regurgitation. No evidence of mitral valve stenosis. Tricuspid Valve: The tricuspid valve is normal in structure. Tricuspid valve regurgitation is not demonstrated. No evidence of tricuspid stenosis. Aortic Valve: The aortic valve is normal in structure. Aortic valve regurgitation is not visualized. No aortic stenosis is present. Aortic valve mean gradient measures 5.0 mmHg. Aortic valve peak gradient measures 8.4 mmHg. Aortic valve area, by VTI measures 1.84 cm. Pulmonic Valve: The pulmonic valve was normal in structure. Pulmonic valve regurgitation is not visualized. No evidence of pulmonic stenosis. Aorta: The aortic root is normal in size and structure. Venous: The inferior vena cava is normal in size with greater than 50% respiratory variability, suggesting right atrial pressure of 3 mmHg. IAS/Shunts: No atrial level shunt detected by color flow Doppler.  LEFT VENTRICLE PLAX 2D LVIDd:         4.00 cm  Diastology LVIDs:         3.10 cm  LV e' medial:    13.80 cm/s LV PW:         1.00 cm  LV E/e' medial:  8.5 LV IVS:        0.90 cm  LV e' lateral:   17.40 cm/s LVOT diam:     2.00 cm  LV E/e' lateral: 6.7 LV SV:         49 LV SV Index:   27 LVOT Area:     3.14 cm  RIGHT VENTRICLE          IVC RV Basal diam:  2.90 cm  IVC diam: 1.40  cm LEFT ATRIUM           Index       RIGHT ATRIUM           Index LA diam:      3.10 cm 1.69 cm/m  RA Area:     13.40 cm LA Vol (A2C): 30.6 ml 16.71 ml/m RA Volume:   32.50 ml  17.75 ml/m LA Vol (A4C): 68.0 ml 37.14 ml/m  AORTIC VALVE AV Area (Vmax):    2.19 cm AV Area (Vmean):   1.95 cm AV Area (VTI):     1.84 cm AV Vmax:           145.00 cm/s AV Vmean:          100.000 cm/s AV VTI:            0.268 m AV Peak Grad:      8.4 mmHg AV Mean Grad:      5.0 mmHg LVOT Vmax:         101.00 cm/s LVOT Vmean:        62.100 cm/s LVOT VTI:          0.157 m LVOT/AV VTI ratio: 0.59  AORTA Ao Root diam: 3.10 cm MITRAL VALVE MV Area (PHT): 3.42 cm     SHUNTS MV Decel Time: 222 msec     Systemic VTI:  0.16 m MV E velocity: 117.00 cm/s  Systemic Diam: 2.00 cm MV A velocity: 77.60 cm/s MV E/A ratio:  1.51 Tobias Alexander MD Electronically signed by Tobias Alexander MD Signature Date/Time: 03/19/2021/12:24:38 PM    Final    CT Angio Chest/Abd/Pel for Dissection W and/or Wo Contrast  Result Date:  03/19/2021 CLINICAL DATA:  Chest pain, elevated troponin, heavy drinking last night, now with nausea, vomiting and epigastric/chest pain EXAM: CT ANGIOGRAPHY CHEST, ABDOMEN AND PELVIS TECHNIQUE: Non-contrast CT of the chest was initially obtained. Multidetector CT imaging through the chest, abdomen and pelvis was performed using the standard protocol during bolus administration of intravenous contrast. Multiplanar reconstructed images and MIPs were obtained and reviewed to evaluate the vascular anatomy. CONTRAST:  OMNIPAQUE IOHEXOL 350 MG/ML SOLN COMPARISON:  Chest radiograph 03/18/2021 FINDINGS: CTA CHEST FINDINGS Cardiovascular: A noncontrast CT of the chest was performed initially. No hyperdense mural thickening to suggest intramural hematoma. Postcontrast administration there is satisfactory preferential opacification of the thoracic aorta. Mild motion artifact seen at the level of the aortic root, ascending aorta and  proximal descending arch. Accounting for motion artifact, no acute luminal abnormality is seen. No periaortic stranding or hemorrhage. Normal 3 vessel branching of the aortic arch. Proximal great vessels are unremarkable. Central pulmonary arteries are normal caliber. No large central or lobar filling defects within limitations non tailored examination of the pulmonary arteries. Normal heart size. No pericardial effusion. Mediastinum/Nodes: No mediastinal fluid or gas. Normal thyroid gland and thoracic inlet. No acute abnormality of the trachea or esophagus. No worrisome mediastinal, hilar or axillary adenopathy. Lungs/Pleura: No consolidation, features of edema, pneumothorax, or effusion. Mild hyperinflation. Tiny benign calcified granuloma seen in the anterior right upper lobe (7/69). No suspicious pulmonary nodules or masses. Musculoskeletal: 0 mild discogenic changes and multilevel Schmorl's node formations present in the spine. Degenerative changes most pronounced at the T11-12 disc space and in the lower cervical levels. Mild arthrosis in the bilateral shoulders as well. No acute or worrisome chest wall abnormalities. Review of the MIP images confirms the above findings. CTA ABDOMEN AND PELVIS FINDINGS VASCULAR Aorta: Normal caliber aorta without aneurysm, dissection, vasculitis or significant stenosis. Celiac: Patent without evidence of aneurysm, dissection, vasculitis or significant stenosis. SMA: Patent without evidence of aneurysm, dissection, vasculitis or significant stenosis. Renals: Single renal arteries bilaterally. Both renal arteries are patent without evidence of aneurysm, dissection, vasculitis, fibromuscular dysplasia or significant stenosis. IMA: Patent without acute luminal abnormality, aneurysm or visible dissection. Inflow: Inflow vessels and proximal outflow vasculature is widely patent bilaterally. No evidence of aneurysm, dissection or vasculitis. Veins: No obvious venous abnormality  within the limitations of this arterial phase study. Review of the MIP images confirms the above findings. NON-VASCULAR Hepatobiliary: While some of the hypoattenuation of liver parenchyma may be related to arterial phase imaging, sparing along the gallbladder fossa does suggest some degree of hepatic steatosis. Smooth liver surface contour. No concerning liver lesion. Normal gallbladder and biliary tree without visible calcified gallstone. Pancreas: No pancreatic ductal dilatation or surrounding inflammatory changes. Spleen: Heterogeneous splenic enhancement is typical for the arterial phase of imaging. Normal in size. No concerning splenic lesions. Adrenals/Urinary Tract: Normal adrenal glands. Kidneys are normally located with symmetric enhancement. No suspicious renal lesion, urolithiasis or hydronephrosis. Urinary bladder is largely decompressed at the time of exam and therefore poorly evaluated by CT imaging. Mild bladder wall thickening may be related to underdistention. No other gross bladder abnormality. Stomach/Bowel: Distal esophagus, stomach and duodenum are unremarkable. No small bowel thickening or dilatation. No evidence of obstruction. Normal appendix in the right lower quadrant. Colon largely decompressed at the time of exam. No significant colonic thickening or dilatation accounting for underdistention. Limited assessment of the mesentery given a paucity of intraperitoneal fat. Lymphatic: No suspicious or enlarged lymph nodes in the included lymphatic chains. Reproductive:  The prostate and seminal vesicles are unremarkable. No acute abnormality of the included external genitalia. Other: No abdominopelvic free fluid or free gas. No bowel containing hernias. Musculoskeletal: No acute osseous abnormality or suspicious osseous lesion. Minimal discogenic changes. Review of the MIP images confirms the above findings. IMPRESSION: 1. No evidence of acute aortic syndrome. 2. Mild circumferential bladder  wall thickening, possibly related to underdistention. Correlate with urinalysis to exclude cystitis. 3. No other acute intrathoracic or intra-abdominal process. 4. Hepatic steatosis. Electronically Signed   By: Kreg Shropshire M.D.   On: 03/19/2021 02:30    Cardiac Studies   Echo: 03/19/21  IMPRESSIONS    1. Left ventricular ejection fraction, by estimation, is 60 to 65%. The  left ventricle has normal function. The left ventricle has no regional  wall motion abnormalities. Left ventricular diastolic parameters were  normal.  2. Right ventricular systolic function is normal. The right ventricular  size is normal.  3. The mitral valve is normal in structure. No evidence of mitral valve  regurgitation. No evidence of mitral stenosis.  4. The aortic valve is normal in structure. Aortic valve regurgitation is  not visualized. No aortic stenosis is present.  5. The inferior vena cava is normal in size with greater than 50%  respiratory variability, suggesting right atrial pressure of 3 mmHg.   Conclusion(s)/Recommendation(s): Normal biventricular function without  evidence of hemodynamically significant valvular heart disease.   Patient Profile     40 y.o. male with PMH of alcohol abuse who presented to [facility] with chest pain and found to have a NSTEMI.   Assessment & Plan    1. NSTEMI with complex tachycardia: hsTn peaked at 7470. EKG on admission showed WCT with IVCD and repolarization abnormalities with follow up EKG showing SR with biatrial enlargement and poor R wave progression. CT angio chest/abd/pelvis was negative for acute process. With elevated cardiac markers and initial abnormal EKG suspect he will need for evaluation with cardiac cath. Echo with normal EF and no rWMA. Will give a clear liquid breakfast, then NPO as he is pain free and cath not likely til this afternoon. -- continue IV heparin -- ASA, statin, and BB -- check Lipids and Hgb A1c  2. Epigastric pain  with n/v: in the setting of excessive ETOH use. -- lipase/LFTs WNL. CT with hepatic steatosis -- management per primary  3. ETOH/Tobacco use: cessation advised  For questions or updates, please contact [facility] Please consult [email or web address] for contact info under        Signed, Laverda Page, NP  03/20/2021, 9:12 AM

## 2021-03-20 NOTE — TOC CAGE-AID Note (Signed)
Transition of Care [facility]) - CAGE-AID Screening   Patient Details  Name: Jorge Ford MRN: [id number] Date of Birth: [birth year]  Transition of Care [facility]) CM/SW Contact:    Gala Lewandowsky, RN Phone Number: 03/20/2021, 1:49 PM   Clinical Narrative:Case Manager received a consult for substance abuse resources. Patient declined outpatient resources at this time.    CAGE-AID Screening: Substance Abuse Screening unable to be completed due to: :  (Screening completed.)  Have You Ever Felt You Ought to Cut Down on Your Drinking or Drug Use?: No Have People Annoyed You By Critizing Your Drinking Or Drug Use?: No Have You Felt Bad Or Guilty About Your Drinking Or Drug Use?: No Have You Ever Had a Drink or Used Drugs First Thing In The Morning to Steady Your Nerves or to Get Rid of a Hangover?: No CAGE-AID Score: 0  Substance Abuse Education Offered: Yes (Declined outpatient resources- Last Friday last use of marijuana.)  Substance abuse interventions: Other (must comment) (declined resources)

## 2021-03-20 NOTE — Care Management (Signed)
1350 03-20-21 Patient lives in [place] with his spouse and children. Patient has a primary care provider Dr. Smith Robert and wife will schedule an appointment post hospitalization. Patient wants to use [facility] if new medications need to be sent to pharmacy. No further needs identified at this time. Graves-Bigelow, Lamar Laundry, RN, BSN Case Manager

## 2021-03-20 NOTE — Progress Notes (Addendum)
The patient has been seen in conjunction with Geoffry ParadiseLindsey Roberts, NP. All aspects of care have been considered and discussed. The patient has been personally interviewed, examined, and all clinical data has been reviewed.   Clinical presentation is that of chest pain after nausea and vomiting, every preceding alcohol intake, tachycardia, and elevated troponin I.  The patient presented with an accelerated idioventricular rhythm which could represent a post perfusion phenomenon.  Surprisingly, echocardiogram is unremarkable.  Consolidating diagnosis is uncertain, and could include myocarditis, ischemia due to coronary disease (or SCAD versus transient vessel occlusion).  Coronary angiography should be performed to exclude anatomic abnormality.  If coronaries are clean, consider cardiac MRI.    Progress Note  Patient Name: Jorge Ford Date of Encounter: 03/20/2021  [facility] Cardiologist: No primary care provider on file.   Subjective   No chest pain this morning. Feeling much better.   Inpatient Medications    Scheduled Meds: . aspirin EC  81 mg Oral Daily  . folic acid  1 mg Oral Daily  . multivitamin with minerals  1 tablet Oral Daily  . nicotine  21 mg Transdermal Daily  . thiamine  100 mg Oral Daily   Or  . thiamine  100 mg Intravenous Daily   Continuous Infusions: . heparin 1,200 Units/hr (03/20/21 0539)  . promethazine (PHENERGAN) injection     PRN Meds: acetaminophen, LORazepam **OR** LORazepam, promethazine (PHENERGAN) injection   Vital Signs    Vitals:   03/19/21 1535 03/19/21 1547 03/19/21 2020 03/20/21 0509  BP:  130/81 127/87 117/81  Pulse:  94 74 60  Resp:  18 16 18   Temp:  99.1 F (37.3 C) 99.1 F (37.3 C) 98.2 F (36.8 C)  TempSrc: Oral Oral Oral Oral  SpO2:  99% 100% 98%  Weight:  64.5 kg  64.7 kg  Height:  6' (1.829 m)      Intake/Output Summary (Last 24 hours) at 03/20/2021 0912 Last data filed at 03/20/2021 0539 Gross per 24 hour   Intake 214.42 ml  Output -  Net 214.42 ml   Last 3 Weights 03/20/2021 03/19/2021 03/18/2021  Weight (lbs) 142 lb 10.2 oz 142 lb 3.2 oz 140 lb  Weight (kg) 64.7 kg 64.501 kg 63.504 kg      Telemetry    SR, few episodes of ST - Personally Reviewed  ECG    SR with biatrial enlargement, poor R wave progression  - Personally Reviewed  Physical Exam   GEN: No acute distress.   Neck: No JVD Cardiac: RRR, no murmurs, rubs, or gallops.  Respiratory: Clear to auscultation bilaterally. GI: Soft, nontender, non-distended  MS: No edema; No deformity. Neuro:  Nonfocal  Psych: Normal affect   Labs    High Sensitivity Troponin:   Recent Labs  Lab 03/18/21 2322 03/19/21 0311 03/19/21 0630 03/19/21 1013  TROPONINIHS 3,231* 6,788* 6,729* 7,470*      Chemistry Recent Labs  Lab 03/18/21 2322 03/19/21 0630  NA 136 134*  K 3.9 4.0  CL 106 105  CO2 14* 17*  GLUCOSE 90 99  BUN 18 17  CREATININE 1.03 1.04  CALCIUM 9.2 9.2  PROT 7.6  --   ALBUMIN 4.5  --   AST 59*  --   ALT 33  --   ALKPHOS 47  --   BILITOT 1.8*  --   GFRNONAA >60 >60  ANIONGAP 16* 12     Hematology Recent Labs  Lab 03/18/21 2322 03/19/21 0630 03/20/21 [id number]  WBC 17.0* 18.4* 7.2  RBC 5.21 4.96 4.43  HGB 16.1 15.2 13.8  HCT 46.3 44.8 39.1  MCV 88.9 90.3 88.3  MCH 30.9 30.6 31.2  MCHC 34.8 33.9 35.3  RDW 13.2 13.3 13.1  PLT 250 265 175    BNPNo results for input(s): BNP, PROBNP in the last 168 hours.   DDimer No results for input(s): DDIMER in the last 168 hours.   Radiology    DG Chest Port 1 View  Result Date: 03/18/2021 CLINICAL DATA:  Chest pain, nausea and vomiting since 7 p.m. EXAM: PORTABLE CHEST 1 VIEW COMPARISON:  Radiograph 07/05/2017 FINDINGS: Chronic hyperinflation. No consolidation, features of edema, pneumothorax, or effusion. Pulmonary vascularity is normally distributed. The cardiomediastinal contours are unremarkable. No acute osseous or soft tissue abnormality. Bilateral  metallic nipple ornamentation. Telemetry leads overlie the chest. IMPRESSION: 1. No acute cardiopulmonary abnormality. 2. Chronic hyperinflation. Electronically Signed   By: Kreg Shropshire M.D.   On: 03/18/2021 23:55   ECHOCARDIOGRAM COMPLETE  Result Date: 03/19/2021    ECHOCARDIOGRAM REPORT   Patient Name:   Jorge Ford Date of Exam: 03/19/2021 Medical Rec #:  [id number]      Height:       72.0 in Accession #:    [id number]     Weight:       140.0 lb Date of Birth:  [birth year]      BSA:          1.831 m Patient Age:    39 years       BP:           124/82 mmHg Patient Gender: M              HR:           92 bpm. Exam Location:  Inpatient Procedure: 2D Echo, Cardiac Doppler and Color Doppler Indications:    R07.9* Chest pain, unspecified  History:        Patient has no prior history of Echocardiogram examinations.                 Abnormal ECG; Risk Factors:Current Smoker and Family History of                 Coronary Artery Disease. ETOH abuse. Nausea and vomitting.  Sonographer:    Roosvelt Maser RDCS Referring Phys: [id number] VASUNDHRA RATHORE IMPRESSIONS  1. Left ventricular ejection fraction, by estimation, is 60 to 65%. The left ventricle has normal function. The left ventricle has no regional wall motion abnormalities. Left ventricular diastolic parameters were normal.  2. Right ventricular systolic function is normal. The right ventricular size is normal.  3. The mitral valve is normal in structure. No evidence of mitral valve regurgitation. No evidence of mitral stenosis.  4. The aortic valve is normal in structure. Aortic valve regurgitation is not visualized. No aortic stenosis is present.  5. The inferior vena cava is normal in size with greater than 50% respiratory variability, suggesting right atrial pressure of 3 mmHg. Conclusion(s)/Recommendation(s): Normal biventricular function without evidence of hemodynamically significant valvular heart disease. FINDINGS  Left Ventricle: Left ventricular ejection  fraction, by estimation, is 60 to 65%. The left ventricle has normal function. The left ventricle has no regional wall motion abnormalities. The left ventricular internal cavity size was normal in size. There is  no left ventricular hypertrophy. Left ventricular diastolic parameters were normal. Right Ventricle: The right ventricular size is normal. No increase in right ventricular wall thickness. Right ventricular systolic  function is normal. Left Atrium: Left atrial size was normal in size. Right Atrium: Right atrial size was normal in size. Pericardium: There is no evidence of pericardial effusion. Mitral Valve: The mitral valve is normal in structure. No evidence of mitral valve regurgitation. No evidence of mitral valve stenosis. Tricuspid Valve: The tricuspid valve is normal in structure. Tricuspid valve regurgitation is not demonstrated. No evidence of tricuspid stenosis. Aortic Valve: The aortic valve is normal in structure. Aortic valve regurgitation is not visualized. No aortic stenosis is present. Aortic valve mean gradient measures 5.0 mmHg. Aortic valve peak gradient measures 8.4 mmHg. Aortic valve area, by VTI measures 1.84 cm. Pulmonic Valve: The pulmonic valve was normal in structure. Pulmonic valve regurgitation is not visualized. No evidence of pulmonic stenosis. Aorta: The aortic root is normal in size and structure. Venous: The inferior vena cava is normal in size with greater than 50% respiratory variability, suggesting right atrial pressure of 3 mmHg. IAS/Shunts: No atrial level shunt detected by color flow Doppler.  LEFT VENTRICLE PLAX 2D LVIDd:         4.00 cm  Diastology LVIDs:         3.10 cm  LV e' medial:    13.80 cm/s LV PW:         1.00 cm  LV E/e' medial:  8.5 LV IVS:        0.90 cm  LV e' lateral:   17.40 cm/s LVOT diam:     2.00 cm  LV E/e' lateral: 6.7 LV SV:         49 LV SV Index:   27 LVOT Area:     3.14 cm  RIGHT VENTRICLE          IVC RV Basal diam:  2.90 cm  IVC diam: 1.40  cm LEFT ATRIUM           Index       RIGHT ATRIUM           Index LA diam:      3.10 cm 1.69 cm/m  RA Area:     13.40 cm LA Vol (A2C): 30.6 ml 16.71 ml/m RA Volume:   32.50 ml  17.75 ml/m LA Vol (A4C): 68.0 ml 37.14 ml/m  AORTIC VALVE AV Area (Vmax):    2.19 cm AV Area (Vmean):   1.95 cm AV Area (VTI):     1.84 cm AV Vmax:           145.00 cm/s AV Vmean:          100.000 cm/s AV VTI:            0.268 m AV Peak Grad:      8.4 mmHg AV Mean Grad:      5.0 mmHg LVOT Vmax:         101.00 cm/s LVOT Vmean:        62.100 cm/s LVOT VTI:          0.157 m LVOT/AV VTI ratio: 0.59  AORTA Ao Root diam: 3.10 cm MITRAL VALVE MV Area (PHT): 3.42 cm     SHUNTS MV Decel Time: 222 msec     Systemic VTI:  0.16 m MV E velocity: 117.00 cm/s  Systemic Diam: 2.00 cm MV A velocity: 77.60 cm/s MV E/A ratio:  1.51 Tobias Alexander MD Electronically signed by Tobias Alexander MD Signature Date/Time: 03/19/2021/12:24:38 PM    Final    CT Angio Chest/Abd/Pel for Dissection W and/or Wo Contrast  Result  Date: 03/19/2021 CLINICAL DATA:  Chest pain, elevated troponin, heavy drinking last night, now with nausea, vomiting and epigastric/chest pain EXAM: CT ANGIOGRAPHY CHEST, ABDOMEN AND PELVIS TECHNIQUE: Non-contrast CT of the chest was initially obtained. Multidetector CT imaging through the chest, abdomen and pelvis was performed using the standard protocol during bolus administration of intravenous contrast. Multiplanar reconstructed images and MIPs were obtained and reviewed to evaluate the vascular anatomy. CONTRAST:  OMNIPAQUE IOHEXOL 350 MG/ML SOLN COMPARISON:  Chest radiograph 03/18/2021 FINDINGS: CTA CHEST FINDINGS Cardiovascular: A noncontrast CT of the chest was performed initially. No hyperdense mural thickening to suggest intramural hematoma. Postcontrast administration there is satisfactory preferential opacification of the thoracic aorta. Mild motion artifact seen at the level of the aortic root, ascending aorta and  proximal descending arch. Accounting for motion artifact, no acute luminal abnormality is seen. No periaortic stranding or hemorrhage. Normal 3 vessel branching of the aortic arch. Proximal great vessels are unremarkable. Central pulmonary arteries are normal caliber. No large central or lobar filling defects within limitations non tailored examination of the pulmonary arteries. Normal heart size. No pericardial effusion. Mediastinum/Nodes: No mediastinal fluid or gas. Normal thyroid gland and thoracic inlet. No acute abnormality of the trachea or esophagus. No worrisome mediastinal, hilar or axillary adenopathy. Lungs/Pleura: No consolidation, features of edema, pneumothorax, or effusion. Mild hyperinflation. Tiny benign calcified granuloma seen in the anterior right upper lobe (7/69). No suspicious pulmonary nodules or masses. Musculoskeletal: 0 mild discogenic changes and multilevel Schmorl's node formations present in the spine. Degenerative changes most pronounced at the T11-12 disc space and in the lower cervical levels. Mild arthrosis in the bilateral shoulders as well. No acute or worrisome chest wall abnormalities. Review of the MIP images confirms the above findings. CTA ABDOMEN AND PELVIS FINDINGS VASCULAR Aorta: Normal caliber aorta without aneurysm, dissection, vasculitis or significant stenosis. Celiac: Patent without evidence of aneurysm, dissection, vasculitis or significant stenosis. SMA: Patent without evidence of aneurysm, dissection, vasculitis or significant stenosis. Renals: Single renal arteries bilaterally. Both renal arteries are patent without evidence of aneurysm, dissection, vasculitis, fibromuscular dysplasia or significant stenosis. IMA: Patent without acute luminal abnormality, aneurysm or visible dissection. Inflow: Inflow vessels and proximal outflow vasculature is widely patent bilaterally. No evidence of aneurysm, dissection or vasculitis. Veins: No obvious venous abnormality  within the limitations of this arterial phase study. Review of the MIP images confirms the above findings. NON-VASCULAR Hepatobiliary: While some of the hypoattenuation of liver parenchyma may be related to arterial phase imaging, sparing along the gallbladder fossa does suggest some degree of hepatic steatosis. Smooth liver surface contour. No concerning liver lesion. Normal gallbladder and biliary tree without visible calcified gallstone. Pancreas: No pancreatic ductal dilatation or surrounding inflammatory changes. Spleen: Heterogeneous splenic enhancement is typical for the arterial phase of imaging. Normal in size. No concerning splenic lesions. Adrenals/Urinary Tract: Normal adrenal glands. Kidneys are normally located with symmetric enhancement. No suspicious renal lesion, urolithiasis or hydronephrosis. Urinary bladder is largely decompressed at the time of exam and therefore poorly evaluated by CT imaging. Mild bladder wall thickening may be related to underdistention. No other gross bladder abnormality. Stomach/Bowel: Distal esophagus, stomach and duodenum are unremarkable. No small bowel thickening or dilatation. No evidence of obstruction. Normal appendix in the right lower quadrant. Colon largely decompressed at the time of exam. No significant colonic thickening or dilatation accounting for underdistention. Limited assessment of the mesentery given a paucity of intraperitoneal fat. Lymphatic: No suspicious or enlarged lymph nodes in the included lymphatic chains.  Reproductive: The prostate and seminal vesicles are unremarkable. No acute abnormality of the included external genitalia. Other: No abdominopelvic free fluid or free gas. No bowel containing hernias. Musculoskeletal: No acute osseous abnormality or suspicious osseous lesion. Minimal discogenic changes. Review of the MIP images confirms the above findings. IMPRESSION: 1. No evidence of acute aortic syndrome. 2. Mild circumferential bladder  wall thickening, possibly related to underdistention. Correlate with urinalysis to exclude cystitis. 3. No other acute intrathoracic or intra-abdominal process. 4. Hepatic steatosis. Electronically Signed   By: Kreg Shropshire M.D.   On: 03/19/2021 02:30    Cardiac Studies   Echo: 03/19/21  IMPRESSIONS    1. Left ventricular ejection fraction, by estimation, is 60 to 65%. The  left ventricle has normal function. The left ventricle has no regional  wall motion abnormalities. Left ventricular diastolic parameters were  normal.  2. Right ventricular systolic function is normal. The right ventricular  size is normal.  3. The mitral valve is normal in structure. No evidence of mitral valve  regurgitation. No evidence of mitral stenosis.  4. The aortic valve is normal in structure. Aortic valve regurgitation is  not visualized. No aortic stenosis is present.  5. The inferior vena cava is normal in size with greater than 50%  respiratory variability, suggesting right atrial pressure of 3 mmHg.   Conclusion(s)/Recommendation(s): Normal biventricular function without  evidence of hemodynamically significant valvular heart disease.   Patient Profile     40 y.o. male with PMH of alcohol abuse who presented to [facility] with chest pain and found to have a NSTEMI.   Assessment & Plan    1. NSTEMI with complex tachycardia: hsTn peaked at 7470. EKG on admission showed WCT with IVCD and repolarization abnormalities with follow up EKG showing SR with biatrial enlargement and poor R wave progression. CT angio chest/abd/pelvis was negative for acute process. With elevated cardiac markers and initial abnormal EKG suspect he will need for evaluation with cardiac cath. Echo with normal EF and no rWMA. Will give a clear liquid breakfast, then NPO as he is pain free and cath not likely til this afternoon. -- continue IV heparin -- ASA, statin, and BB -- check Lipids and Hgb A1c  2. Epigastric pain  with n/v: in the setting of excessive ETOH use. -- lipase/LFTs WNL. CT with hepatic steatosis -- management per primary  3. ETOH/Tobacco use: cessation advised  For questions or updates, please contact [facility] Please consult [email or web address] for contact info under        Signed, Laverda Page, NP  03/20/2021, 9:12 AM

## 2021-03-21 ENCOUNTER — Encounter (HOSPITAL_COMMUNITY): Payer: Self-pay | Admitting: Cardiovascular Disease

## 2021-03-21 ENCOUNTER — Inpatient Hospital Stay (HOSPITAL_COMMUNITY): Payer: Medicaid Other

## 2021-03-21 DIAGNOSIS — I214 Non-ST elevation (NSTEMI) myocardial infarction: Secondary | ICD-10-CM

## 2021-03-21 LAB — CBC
HCT: 39.3 % (ref 39.0–52.0)
Hemoglobin: 14 g/dL (ref 13.0–17.0)
MCH: 31.5 pg (ref 26.0–34.0)
MCHC: 35.6 g/dL (ref 30.0–36.0)
MCV: 88.5 fL (ref 80.0–100.0)
Platelets: 172 10*3/uL (ref 150–400)
RBC: 4.44 MIL/uL (ref 4.22–5.81)
RDW: 12.9 % (ref 11.5–15.5)
WBC: 7.7 10*3/uL (ref 4.0–10.5)
nRBC: 0 % (ref 0.0–0.2)

## 2021-03-21 LAB — BASIC METABOLIC PANEL
Anion gap: 8 (ref 5–15)
BUN: 10 mg/dL (ref 6–20)
CO2: 24 mmol/L (ref 22–32)
Calcium: 8.8 mg/dL — ABNORMAL LOW (ref 8.9–10.3)
Chloride: 106 mmol/L (ref 98–111)
Creatinine, Ser: 0.77 mg/dL (ref 0.61–1.24)
GFR, Estimated: >60 mL/min (ref >60)
Glucose, Bld: 94 mg/dL (ref 70–99)
Potassium: 3.5 mmol/L (ref 3.5–5.1)
Sodium: 138 mmol/L (ref 135–145)

## 2021-03-21 LAB — TROPONIN I (HIGH SENSITIVITY)
Troponin I (High Sensitivity): 1696 ng/L (ref <18)
Troponin I (High Sensitivity): 1452 ng/L (ref <18)

## 2021-03-21 LAB — LIPID PANEL
Cholesterol: 118 mg/dL (ref 0–200)
HDL: 50 mg/dL (ref >40)
LDL Cholesterol: 54 mg/dL (ref 0–99)
Total CHOL/HDL Ratio: 2.4 RATIO
Triglycerides: 69 mg/dL (ref <150)
VLDL: 14 mg/dL (ref 0–40)

## 2021-03-21 LAB — HEMOGLOBIN A1C
Hgb A1c MFr Bld: 5.6 % (ref 4.8–5.6)
Mean Plasma Glucose: 114.02 mg/dL

## 2021-03-21 MED ORDER — ASPIRIN 81 MG PO TBEC
81.0000 mg | DELAYED_RELEASE_TABLET | Freq: Every day | ORAL | 11 refills | Status: AC
Start: 2021-03-22 — End: ?

## 2021-03-21 MED ORDER — GADOBUTROL 1 MMOL/ML IV SOLN
6.0000 mL | Freq: Once | INTRAVENOUS | Status: AC | PRN
  Administered 2021-03-21: 6 mL via INTRAVENOUS

## 2021-03-21 MED ORDER — METOPROLOL TARTRATE 25 MG PO TABS: 12.5000 mg | ORAL_TABLET | Freq: Two times a day (BID) | ORAL | 2 refills | Status: AC

## 2021-03-21 MED ORDER — ATORVASTATIN CALCIUM 20 MG PO TABS: 20.0000 mg | ORAL_TABLET | Freq: Every day | ORAL | 2 refills | Status: AC

## 2021-03-21 MED ORDER — ATORVASTATIN CALCIUM 10 MG PO TABS
20.0000 mg | ORAL_TABLET | Freq: Every day | ORAL | Status: DC
  Administered 2021-03-21: 20 mg via ORAL
  Filled 2021-03-21: qty 2

## 2021-03-21 NOTE — Progress Notes (Signed)
PROGRESS NOTE    Jorge Ford  WGN:[id number] DOB: 07/20/1981 DOA: 03/18/2021 PCP: Toma DeitersHasanaj, Xaje A, MD    Brief Narrative:  40 yo male with alcohol use disorder, no significant medical problems [facility] with chest pain and found to have elevated troponins without ischemic changes on EKG and [facility].  Since then admitted to the hospital with heparin infusion, cardiac cath plans.   Assessment & Plan:   Principal Problem:   NSTEMI (non-ST elevated myocardial infarction) (HCC) Active Problems:   Nausea & vomiting   Leukocytosis   Metabolic acidosis   Tobacco use  Elevated troponins: Troponin [phone].  Remains flat since then.  Chest pain-free since admission.  Hemodynamically stable. EKG nonischemic. Cardiac cath because of significant rise in troponin with normal EF, normal coronaries. Cardiac MRI today, results pending. Remains on aspirin.  Statin and beta-blockers. Followed by cardiology.  Probable home today after MRI results.  Leukocytosis: Reactive.  Normalized.  Alcohol use: Counseled to quit.  He is receptive to quit.  On CIWA scale and multivitamins.  So far is stable.  No withdrawals.   DVT prophylaxis: heparin injection 5,000 Units Start: 03/20/21 2200   Code Status: Full code Family Communication: None Disposition Plan: Status is: Inpatient  Remains inpatient appropriate because:Inpatient level of care appropriate due to severity of illness   Dispo: The patient is from: Home              Anticipated d/c is to: Home              Patient currently is medically stabilizing.   Difficult to place patient No         Consultants:   Cardiology  Procedures:   Cardiac cath 3/21, normal coronaries.  Normal ejection fraction.  Antimicrobials:   None   Subjective: No complaints. "Will go home when you all ok with me"  Objective: Vitals:   03/21/21 0025 03/21/21 0510 03/21/21 0838 03/21/21 1435  BP: 119/80  114/75 130/85 112/75  Pulse: (!) 54 (!) 58 61 75  Resp: 14 16  18   Temp: 98.8 F (37.1 C) 97.6 F (36.4 C)  98.8 F (37.1 C)  TempSrc: Oral Oral  Oral  SpO2: 96% 97%  98%  Weight:  62.5 kg    Height:        Intake/Output Summary (Last 24 hours) at 03/21/2021 1729 Last data filed at 03/21/2021 0900 Gross per 24 hour  Intake 607.53 ml  Output --  Net 607.53 ml   Filed Weights   03/19/21 1547 03/20/21 0509 03/21/21 0510  Weight: 64.5 kg 64.7 kg 62.5 kg    Examination:  General exam: Appears calm and comfortable  Respiratory system: Clear to auscultation. Respiratory effort normal. Cardiovascular system: S1 & S2 heard, RRR. No JVD, murmurs, rubs, gallops or clicks. No pedal edema. Gastrointestinal system: Abdomen is nondistended, soft and nontender. No organomegaly or masses felt. Normal bowel sounds heard. Central nervous system: Alert and oriented. No focal neurological deficits. Extremities: Symmetric 5 x 5 power. Skin: No rashes, lesions or ulcers Psychiatry: Judgement and insight appear normal. Mood & affect appropriate.     Data Reviewed: I have personally reviewed following labs and imaging studies  CBC: Recent Labs  Lab 03/18/21 2322 03/19/21 0630 03/20/21 0655 03/21/21 0301  WBC 17.0* 18.4* 7.2 7.7  NEUTROABS 14.8*  --   --   --   HGB 16.1 15.2 13.8 14.0  HCT 46.3 44.8 39.1 39.3  MCV  88.9 90.3 88.3 88.5  PLT 250 265 175 172   Basic Metabolic Panel: Recent Labs  Lab 03/18/21 2322 03/19/21 0630 03/21/21 0301  NA 136 134* 138  K 3.9 4.0 3.5  CL 106 105 106  CO2 14* 17* 24  GLUCOSE 90 99 94  BUN 18 17 10   CREATININE 1.03 1.04 0.77  CALCIUM 9.2 9.2 8.8*  MG  --  2.0  --   PHOS  --  5.0*  --    GFR: Estimated Creatinine Clearance: 109.6 mL/min (by C-G formula based on SCr of 0.77 mg/dL). Liver Function Tests: Recent Labs  Lab 03/18/21 2322  AST 59*  ALT 33  ALKPHOS 47  BILITOT 1.8*  PROT 7.6  ALBUMIN 4.5   Recent Labs  Lab  03/18/21 2322  LIPASE 39   No results for input(s): AMMONIA in the last 168 hours. Coagulation Profile: No results for input(s): INR, PROTIME in the last 168 hours. Cardiac Enzymes: No results for input(s): CKTOTAL, CKMB, CKMBINDEX, TROPONINI in the last 168 hours. BNP (last 3 results) No results for input(s): PROBNP in the last 8760 hours. HbA1C: Recent Labs    03/21/21 0301  HGBA1C 5.6   CBG: No results for input(s): GLUCAP in the last 168 hours. Lipid Profile: Recent Labs    03/21/21 0301  CHOL 118  HDL 50  LDLCALC 54  TRIG 69  CHOLHDL 2.4   Thyroid Function Tests: No results for input(s): TSH, T4TOTAL, FREET4, T3FREE, THYROIDAB in the last 72 hours. Anemia Panel: No results for input(s): VITAMINB12, FOLATE, FERRITIN, TIBC, IRON, RETICCTPCT in the last 72 hours. Sepsis Labs: Recent Labs  Lab 03/19/21 0630  PROCALCITON 0.10  LATICACIDVEN 1.1    Recent Results (from the past 240 hour(s))  Resp Panel by RT-PCR (Flu A&B, Covid) Nasopharyngeal Swab     Status: None   Collection Time: 03/18/21 11:32 PM   Specimen: Nasopharyngeal Swab; Nasopharyngeal(NP) swabs in vial transport medium  Result Value Ref Range Status   SARS Coronavirus 2 by RT PCR NEGATIVE NEGATIVE Final    Comment: (NOTE) SARS-CoV-2 target nucleic acids are NOT DETECTED.  The SARS-CoV-2 RNA is generally detectable in upper respiratory specimens during the acute phase of infection. The lowest concentration of SARS-CoV-2 viral copies this assay can detect is 138 copies/mL. A negative result does not preclude SARS-Cov-2 infection and should not be used as the sole basis for treatment or other patient management decisions. A negative result may occur with  improper specimen collection/handling, submission of specimen other than nasopharyngeal swab, presence of viral mutation(s) within the areas targeted by this assay, and inadequate number of viral copies(<138 copies/mL). A negative result must be  combined with clinical observations, patient history, and epidemiological information. The expected result is Negative.  Fact Sheet for Patients:  03/20/21  Fact Sheet for Healthcare Providers:  BloggerCourse.com  This test is no t yet approved or cleared by the [place] FDA and  has been authorized for detection and/or diagnosis of SARS-CoV-2 by FDA under an Emergency Use Authorization (EUA). This EUA will remain  in effect (meaning this test can be used) for the duration of the COVID-19 declaration under Section 564(b)(1) of the Act, 21 U.S.C.section 360bbb-3(b)(1), unless the authorization is terminated  or revoked sooner.       Influenza A by PCR NEGATIVE NEGATIVE Final   Influenza B by PCR NEGATIVE NEGATIVE Final    Comment: (NOTE) The Xpert Xpress SARS-CoV-2/FLU/RSV plus assay is intended as an aid in  the diagnosis of influenza from Nasopharyngeal swab specimens and should not be used as a sole basis for treatment. Nasal washings and aspirates are unacceptable for Xpert Xpress SARS-CoV-2/FLU/RSV testing.  Fact Sheet for Patients: BloggerCourse.com  Fact Sheet for Healthcare Providers: Serious[employer]ker[employer]it  This test is not yet approved or cleared by the [place] FDA and has been authorized for detection and/or diagnosis of SARS-CoV-2 by FDA under an Emergency Use Authorization (EUA). This EUA will remain in effect (meaning this test can be used) for the duration of the COVID-19 declaration under Section 564(b)(1) of the Act, 21 U.S.C. section 360bbb-3(b)(1), unless the authorization is terminated or revoked.  Performed at [facility], [address] [place], [place] [address]   Culture, blood (routine x 2)     Status: None (Preliminary result)   Collection Time: 03/19/21  6:01 AM   Specimen: BLOOD LEFT WRIST  Result Value Ref Range Status    Specimen Description BLOOD LEFT WRIST  Final   Special Requests   Final    BOTTLES DRAWN AEROBIC AND ANAEROBIC Blood Culture adequate volume   Culture   Final    NO GROWTH 2 DAYS Performed at [facility], [address], [place], [place] [address]    Report Status PENDING  Incomplete  Culture, blood (routine x 2)     Status: None (Preliminary result)   Collection Time: 03/19/21  6:06 AM   Specimen: BLOOD  Result Value Ref Range Status   Specimen Description BLOOD RIGHT ANTECUBITAL  Final   Special Requests   Final    BOTTLES DRAWN AEROBIC AND ANAEROBIC Blood Culture results may not be optimal due to an inadequate volume of blood received in culture bottles   Culture   Final    NO GROWTH 2 DAYS Performed at [facility], [address] [place], [place] [address]    Report Status PENDING  Incomplete         Radiology Studies: CARDIAC CATHETERIZATION  Result Date: 03/20/2021 Mihail Prettyman is a 40 y.o. male  [id number] LOCATION:  FACILITY: [facility] PHYSICIAN: Nanetta Batty, M.D. Mar 28, 1981 DATE OF PROCEDURE:  03/20/2021 DATE OF DISCHARGE: CARDIAC CATHETERIZATION History obtained from chart review.40 y.o. male with PMH of alcohol abuse who presented to [facility] with chest pain and found to have a NSTEMI.  His troponins rose to 7000 level.  His 2D echo was normal.  He said no recurrent chest pain.  He presents now for diagnostic coronary angiography to rule out an ischemic etiology.   Mr. Chimento has normal coronary arteries and an LVEDP of 1 suggesting that he is "dry".  There is no "culprit lesion" to explain his non-STEMI.  He may have had coronary vasospasm.  The sheath was removed and a TR band was placed on the right wrist to achieve patent hemostasis.  The patient left lab in stable condition. Nanetta Batty. MD, [facility] 03/20/2021 3:17 PM        Scheduled Meds:  aspirin EC  81 mg Oral Daily   atorvastatin  20 mg Oral Daily   folic acid  1 mg Oral Daily    heparin injection (subcutaneous)  5,000 Units Subcutaneous Q8H   metoprolol tartrate  12.5 mg Oral BID   multivitamin with minerals  1 tablet Oral Daily   nicotine  21 mg Transdermal Daily   sodium chloride flush  3 mL Intravenous Q12H   thiamine  100 mg Oral Daily   Or   thiamine  100 mg Intravenous Daily   Continuous Infusions:  sodium chloride     promethazine (PHENERGAN) injection       LOS: 2 days    Time spent: 25 minutes    Dorcas Carrow, MD [facility] Pager [phone]

## 2021-03-21 NOTE — Discharge Summary (Signed)
Physician Discharge Summary  Jorge Ford ZOX:[id number] DOB: January 13, 1981 DOA: 03/18/2021  PCP: Jorge Deiters, MD  Admit date: 03/18/2021 Discharge date: 03/21/2021  Admitted From: home  Disposition: home   Recommendations for Outpatient Follow-up:  1. Follow up with PCP in 1-2 weeks 2. Cardiology to schedule follow up at office   Home Health:NA Equipment/Devices:Not needed   Discharge Condition: stable   CODE STATUS: full code  Diet recommendation: low salt diet, no alcohol   Discharge Summary: 40 yo male with alcohol use disorder, no significant medical problems went to [facility] with chest pain and found to have elevated troponins without ischemic changes on EKG and transferred to [facility], ER. Since then admitted to the hospital with heparin infusion, underwent cardiac cath and cardiac MRI  Myocardial infarction with nonobstructive coronary artery: Elevated troponin from [phone] and remains flat, decreased level subsequently. Initial chest pain and subsequently without any chest pain. EKG with some PVCs, no ischemic changes. Cardiac catheterization due to significant elevated troponins, normal coronaries. MRI with small apical infarct, ejection fraction 35%, normal thickness of the myocardium. Patient asymptomatic since admission. As per cardiology recommendation, going home with aspirin 81 mg daily, metoprolol 12.5 mg twice a day, atorvastatin 20 mg daily.  Cardiology office will schedule follow-up.  Recommended complete alcohol cessation and smoking cessation.  Patient is euvolemic on discharge.      Discharge Diagnoses:  Principal Problem:   NSTEMI (non-ST elevated myocardial infarction) [facility]) Active Problems:   Nausea & vomiting   Leukocytosis   Metabolic acidosis   Tobacco use    Discharge Instructions  Discharge Instructions    Call MD for:  difficulty breathing, headache or visual disturbances   Complete by: As directed    Call MD for:  severe  uncontrolled pain   Complete by: As directed    Diet - low sodium heart healthy   Complete by: As directed    Discharge instructions   Complete by: As directed    Do not drink alcohol   Increase activity slowly   Complete by: As directed      Allergies as of 03/21/2021   No Known Allergies     Medication List    TAKE these medications   acetaminophen 500 MG tablet Commonly known as: TYLENOL Take 500 mg by mouth every 6 (six) hours as needed for moderate pain or headache.   aspirin 81 MG EC tablet Take 1 tablet (81 mg total) by mouth daily. Swallow whole. Start taking on: March 22, 2021   atorvastatin 20 MG tablet Commonly known as: LIPITOR Take 1 tablet (20 mg total) by mouth daily. Start taking on: March 22, 2021   metoprolol tartrate 25 MG tablet Commonly known as: LOPRESSOR Take 0.5 tablets (12.5 mg total) by mouth 2 (two) times daily.       Follow-up Information    Jorge Deiters, MD Follow up in 2 week(s).   Specialty: Internal Medicine Contact information: [address] [place] [address] [phone]              No Known Allergies  Consultations:  Cardiology    Procedures/Studies: CARDIAC CATHETERIZATION  Result Date: 03/20/2021 Jorge Ford is a 40 y.o. male  [id number] LOCATION:  FACILITY: [facility] PHYSICIAN: Jorge Ford, M.D. [birth year] DATE OF PROCEDURE:  03/20/2021 DATE OF DISCHARGE: CARDIAC CATHETERIZATION History obtained from chart review.40 y.o. male with PMH of alcohol abuse who presented to [facility] with chest pain and found to have a  NSTEMI.  His troponins rose to 7000 level.  His 2D echo was normal.  He said no recurrent chest pain.  He presents now for diagnostic coronary angiography to rule out an ischemic etiology.   [name] has normal coronary arteries and an LVEDP of 1 suggesting that he is "dry".  There is no "culprit lesion" to explain his non-STEMI.  He may have had coronary vasospasm.  The sheath was removed  and a TR band was placed on the right wrist to achieve patent hemostasis.  The patient left lab in stable condition. Jorge BattyJonathan Ford. MD, [facility] HospitalFACC 03/20/2021 3:17 PM   DG Chest Port 1 View  Result Date: 03/18/2021 CLINICAL DATA:  Chest pain, nausea and vomiting since 7 p.m. EXAM: PORTABLE CHEST 1 VIEW COMPARISON:  Radiograph 07/05/2017 FINDINGS: Chronic hyperinflation. No consolidation, features of edema, pneumothorax, or effusion. Pulmonary vascularity is normally distributed. The cardiomediastinal contours are unremarkable. No acute osseous or soft tissue abnormality. Bilateral metallic nipple ornamentation. Telemetry leads overlie the chest. IMPRESSION: 1. No acute cardiopulmonary abnormality. 2. Chronic hyperinflation. Electronically Signed   By: Jorge ShropshirePrice  Jorge Ford M.D.   On: 03/18/2021 23:55   MR CARDIAC MORPHOLOGY W WO CONTRAST  Result Date: 03/21/2021 CLINICAL DATA:  Troponin elevation, normal coronary arteries EXAM: CARDIAC MRI TECHNIQUE: The patient was scanned on a 1.5 Tesla Siemens magnet. A dedicated cardiac coil was used. Functional imaging was done using Fiesta sequences. 2,3, and 4 chamber views were done to assess for RWMA's. Modified Simpson's rule using a short axis stack was used to calculate an ejection fraction on a dedicated work Research officer, trade unionstation using Circle software. The patient received 6 cc of Gadavist. After 10 minutes inversion recovery sequences were used to assess for infiltration and scar tissue. CONTRAST:  6 cc  of Gadavist FINDINGS: Left ventricle: -Normal size -Normal wall thickness -Low normal systolic function. Focal hypokinesis of apical inferoseptum -Transmural LGE in focal area of apical inferoseptum, also involving RV medial-inferior wall. Suggests small infarct. -Normal ECV (28%), though focal elevation in apical inferoseptum along with elevated T2 (70ms) in this area consistent with myocardial edema LV EF: 51% (Normal 56-78%) Absolute volumes: LV EDV: 157mL (Normal 77-195 mL) LV ESV:  76mL (Normal 19-72 mL) LV SV: 81mL (Normal 51-133 mL) CO: 4.4L/min (Normal 2.8-8.8 L/min) Indexed volumes: LV EDV: 6288mL/sq-m (Normal 47-92 mL/sq-m) LV ESV: 6543mL/sq-m (Normal 13-30 mL/sq-m) LV SV: 3845mL/sq-m (Normal 32-62 mL/sq-m) CI: 2.5L/min/sq-m (Normal 1.7-4.2 L/min/sq-m) Right ventricle: Normal size and systolic function RV EF:  54% (Normal 47-74%) Absolute volumes: RV EDV: 160mL (Normal 88-227 mL) RV ESV: 73mL (Normal 23-103 mL) RV SV: 87mL (Normal 52-138 mL) CO: 4.8L/min (Normal 2.8-8.8 L/min) Indexed volumes: RV EDV: 7990mL/sq-m (Normal 55-105 mL/sq-m) RV ESV: 4641mL/sq-m (Normal 15-43 mL/sq-m) RV SV: 5449mL/sq-m (Normal 32-64 mL/sq-m) CI: 2.7L/min/sq-m (Normal 1.7-4.2 L/min/sq-m) Left atrium: Normal size Right atrium: Normal size Mitral valve: No regurgitation Aortic valve: Tricuspid.  No regurgitation Tricuspid valve: No regurgitation Pulmonic valve: No regurgitation Aorta: Normal proximal ascending aorta Pericardium: Normal IMPRESSION: 1. Transmural late gadolinium enhancement in focal area of LV apical inferoseptum, also involving RV medial-inferior wall. Consistent with small infarct. 2. Normal LV size, normal wall thickness, and low normal systolic function (EF 51%). Focal hypokinesis of apical inferoseptum 3.  Normal RV size and systolic function (EF 54%) Electronically Signed   By: Epifanio Lescheshristopher  Schumann MD   On: 03/21/2021 18:55   ECHOCARDIOGRAM COMPLETE  Result Date: 03/19/2021    ECHOCARDIOGRAM REPORT   Patient Name:   Maisie FusHOMAS Tiso Date of  Exam: 03/19/2021 Medical Rec #:  [id number]      Height:       72.0 in Accession #:    [id number]     Weight:       140.0 lb Date of Birth:  [birth year]      BSA:          1.831 m Patient Age:    39 years       BP:           124/82 mmHg Patient Gender: M              HR:           92 bpm. Exam Location:  Inpatient Procedure: 2D Echo, Cardiac Doppler and Color Doppler Indications:    R07.9* Chest pain, unspecified  History:        Patient has no prior history of  Echocardiogram examinations.                 Abnormal ECG; Risk Factors:Current Smoker and Family History of                 Coronary Artery Disease. ETOH abuse. Nausea and vomitting.  Sonographer:    Roosvelt Maser RDCS Referring Phys: [id number] VASUNDHRA RATHORE IMPRESSIONS  1. Left ventricular ejection fraction, by estimation, is 60 to 65%. The left ventricle has normal function. The left ventricle has no regional wall motion abnormalities. Left ventricular diastolic parameters were normal.  2. Right ventricular systolic function is normal. The right ventricular size is normal.  3. The mitral valve is normal in structure. No evidence of mitral valve regurgitation. No evidence of mitral stenosis.  4. The aortic valve is normal in structure. Aortic valve regurgitation is not visualized. No aortic stenosis is present.  5. The inferior vena cava is normal in size with greater than 50% respiratory variability, suggesting right atrial pressure of 3 mmHg. Conclusion(s)/Recommendation(s): Normal biventricular function without evidence of hemodynamically significant valvular heart disease. FINDINGS  Left Ventricle: Left ventricular ejection fraction, by estimation, is 60 to 65%. The left ventricle has normal function. The left ventricle has no regional wall motion abnormalities. The left ventricular internal cavity size was normal in size. There is  no left ventricular hypertrophy. Left ventricular diastolic parameters were normal. Right Ventricle: The right ventricular size is normal. No increase in right ventricular wall thickness. Right ventricular systolic function is normal. Left Atrium: Left atrial size was normal in size. Right Atrium: Right atrial size was normal in size. Pericardium: There is no evidence of pericardial effusion. Mitral Valve: The mitral valve is normal in structure. No evidence of mitral valve regurgitation. No evidence of mitral valve stenosis. Tricuspid Valve: The tricuspid valve is normal in  structure. Tricuspid valve regurgitation is not demonstrated. No evidence of tricuspid stenosis. Aortic Valve: The aortic valve is normal in structure. Aortic valve regurgitation is not visualized. No aortic stenosis is present. Aortic valve mean gradient measures 5.0 mmHg. Aortic valve peak gradient measures 8.4 mmHg. Aortic valve area, by VTI measures 1.84 cm. Pulmonic Valve: The pulmonic valve was normal in structure. Pulmonic valve regurgitation is not visualized. No evidence of pulmonic stenosis. Aorta: The aortic root is normal in size and structure. Venous: The inferior vena cava is normal in size with greater than 50% respiratory variability, suggesting right atrial pressure of 3 mmHg. IAS/Shunts: No atrial level shunt detected by color flow Doppler.  LEFT VENTRICLE PLAX 2D LVIDd:         4.00  cm  Diastology LVIDs:         3.10 cm  LV e' medial:    13.80 cm/s LV PW:         1.00 cm  LV E/e' medial:  8.5 LV IVS:        0.90 cm  LV e' lateral:   17.40 cm/s LVOT diam:     2.00 cm  LV E/e' lateral: 6.7 LV SV:         49 LV SV Index:   27 LVOT Area:     3.14 cm  RIGHT VENTRICLE          IVC RV Basal diam:  2.90 cm  IVC diam: 1.40 cm LEFT ATRIUM           Index       RIGHT ATRIUM           Index LA diam:      3.10 cm 1.69 cm/m  RA Area:     13.40 cm LA Vol (A2C): 30.6 ml 16.71 ml/m RA Volume:   32.50 ml  17.75 ml/m LA Vol (A4C): 68.0 ml 37.14 ml/m  AORTIC VALVE AV Area (Vmax):    2.19 cm AV Area (Vmean):   1.95 cm AV Area (VTI):     1.84 cm AV Vmax:           145.00 cm/s AV Vmean:          100.000 cm/s AV VTI:            0.268 m AV Peak Grad:      8.4 mmHg AV Mean Grad:      5.0 mmHg LVOT Vmax:         101.00 cm/s LVOT Vmean:        62.100 cm/s LVOT VTI:          0.157 m LVOT/AV VTI ratio: 0.59  AORTA Ao Root diam: 3.10 cm MITRAL VALVE MV Area (PHT): 3.42 cm     SHUNTS MV Decel Time: 222 msec     Systemic VTI:  0.16 m MV E velocity: 117.00 cm/s  Systemic Diam: 2.00 cm MV A velocity: 77.60 cm/s MV E/A  ratio:  1.51 Tobias Alexander MD Electronically signed by Tobias Alexander MD Signature Date/Time: 03/19/2021/12:24:38 PM    Final    CT Angio Chest/Abd/Pel for Dissection W and/or Wo Contrast  Result Date: 03/19/2021 CLINICAL DATA:  Chest pain, elevated troponin, heavy drinking last night, now with nausea, vomiting and epigastric/chest pain EXAM: CT ANGIOGRAPHY CHEST, ABDOMEN AND PELVIS TECHNIQUE: Non-contrast CT of the chest was initially obtained. Multidetector CT imaging through the chest, abdomen and pelvis was performed using the standard protocol during bolus administration of intravenous contrast. Multiplanar reconstructed images and MIPs were obtained and reviewed to evaluate the vascular anatomy. CONTRAST:  OMNIPAQUE IOHEXOL 350 MG/ML SOLN COMPARISON:  Chest radiograph 03/18/2021 FINDINGS: CTA CHEST FINDINGS Cardiovascular: A noncontrast CT of the chest was performed initially. No hyperdense mural thickening to suggest intramural hematoma. Postcontrast administration there is satisfactory preferential opacification of the thoracic aorta. Mild motion artifact seen at the level of the aortic root, ascending aorta and proximal descending arch. Accounting for motion artifact, no acute luminal abnormality is seen. No periaortic stranding or hemorrhage. Normal 3 vessel branching of the aortic arch. Proximal great vessels are unremarkable. Central pulmonary arteries are normal caliber. No large central or lobar filling defects within limitations non tailored examination of the pulmonary arteries. Normal heart size. No pericardial  effusion. Mediastinum/Nodes: No mediastinal fluid or gas. Normal thyroid gland and thoracic inlet. No acute abnormality of the trachea or esophagus. No worrisome mediastinal, hilar or axillary adenopathy. Lungs/Pleura: No consolidation, features of edema, pneumothorax, or effusion. Mild hyperinflation. Tiny benign calcified granuloma seen in the anterior right upper lobe (7/69).  No suspicious pulmonary nodules or masses. Musculoskeletal: 0 mild discogenic changes and multilevel Schmorl's node formations present in the spine. Degenerative changes most pronounced at the T11-12 disc space and in the lower cervical levels. Mild arthrosis in the bilateral shoulders as well. No acute or worrisome chest wall abnormalities. Review of the MIP images confirms the above findings. CTA ABDOMEN AND PELVIS FINDINGS VASCULAR Aorta: Normal caliber aorta without aneurysm, dissection, vasculitis or significant stenosis. Celiac: Patent without evidence of aneurysm, dissection, vasculitis or significant stenosis. SMA: Patent without evidence of aneurysm, dissection, vasculitis or significant stenosis. Renals: Single renal arteries bilaterally. Both renal arteries are patent without evidence of aneurysm, dissection, vasculitis, fibromuscular dysplasia or significant stenosis. IMA: Patent without acute luminal abnormality, aneurysm or visible dissection. Inflow: Inflow vessels and proximal outflow vasculature is widely patent bilaterally. No evidence of aneurysm, dissection or vasculitis. Veins: No obvious venous abnormality within the limitations of this arterial phase study. Review of the MIP images confirms the above findings. NON-VASCULAR Hepatobiliary: While some of the hypoattenuation of liver parenchyma may be related to arterial phase imaging, sparing along the gallbladder fossa does suggest some degree of hepatic steatosis. Smooth liver surface contour. No concerning liver lesion. Normal gallbladder and biliary tree without visible calcified gallstone. Pancreas: No pancreatic ductal dilatation or surrounding inflammatory changes. Spleen: Heterogeneous splenic enhancement is typical for the arterial phase of imaging. Normal in size. No concerning splenic lesions. Adrenals/Urinary Tract: Normal adrenal glands. Kidneys are normally located with symmetric enhancement. No suspicious renal lesion, urolithiasis  or hydronephrosis. Urinary bladder is largely decompressed at the time of exam and therefore poorly evaluated by CT imaging. Mild bladder wall thickening may be related to underdistention. No other gross bladder abnormality. Stomach/Bowel: Distal esophagus, stomach and duodenum are unremarkable. No small bowel thickening or dilatation. No evidence of obstruction. Normal appendix in the right lower quadrant. Colon largely decompressed at the time of exam. No significant colonic thickening or dilatation accounting for underdistention. Limited assessment of the mesentery given a paucity of intraperitoneal fat. Lymphatic: No suspicious or enlarged lymph nodes in the included lymphatic chains. Reproductive: The prostate and seminal vesicles are unremarkable. No acute abnormality of the included external genitalia. Other: No abdominopelvic free fluid or free gas. No bowel containing hernias. Musculoskeletal: No acute osseous abnormality or suspicious osseous lesion. Minimal discogenic changes. Review of the MIP images confirms the above findings. IMPRESSION: 1. No evidence of acute aortic syndrome. 2. Mild circumferential bladder wall thickening, possibly related to underdistention. Correlate with urinalysis to exclude cystitis. 3. No other acute intrathoracic or intra-abdominal process. 4. Hepatic steatosis. Electronically Signed   By: Jorge Ford M.D.   On: 03/19/2021 02:30    (Echo, Carotid, EGD, Colonoscopy, ERCP)    Subjective: no complaints . Eager to go home    Discharge Exam: Vitals:   03/21/21 0838 03/21/21 1435  BP: 130/85 112/75  Pulse: 61 75  Resp:  18  Temp:  98.8 F (37.1 C)  SpO2:  98%   Vitals:   03/21/21 0025 03/21/21 0510 03/21/21 0838 03/21/21 1435  BP: 119/80 114/75 130/85 112/75  Pulse: (!) 54 (!) 58 61 75  Resp: Temp: 98.8 F (  37.1 C) 97.6 F (36.4 C)  98.8 F (37.1 C)  TempSrc: Oral Oral  Oral  SpO2: 96% 97%  98%  Weight:  62.5 kg    Height:         General: Pt is alert, awake, not in acute distress Cardiovascular: RRR, S1/S2 +, no rubs, no gallops Respiratory: CTA bilaterally, no wheezing, no rhonchi Abdominal: Soft, NT, ND, bowel sounds + Extremities: no edema, no cyanosis  On room air, walking around hallway.  The results of significant diagnostics from this hospitalization (including imaging, microbiology, ancillary and laboratory) are listed below for reference.     Microbiology: Recent Results (from the past 240 hour(s))  Resp Panel by RT-PCR (Flu A&B, Covid) Nasopharyngeal Swab     Status: None   Collection Time: 03/18/21 11:32 PM   Specimen: Nasopharyngeal Swab; Nasopharyngeal(NP) swabs in vial transport medium  Result Value Ref Range Status   SARS Coronavirus 2 by RT PCR NEGATIVE NEGATIVE Final    Comment: (NOTE) SARS-CoV-2 target nucleic acids are NOT DETECTED.  The SARS-CoV-2 RNA is generally detectable in upper respiratory specimens during the acute phase of infection. The lowest concentration of SARS-CoV-2 viral copies this assay can detect is 138 copies/mL. A negative result does not preclude SARS-Cov-2 infection and should not be used as the sole basis for treatment or other patient management decisions. A negative result may occur with  improper specimen collection/handling, submission of specimen other than nasopharyngeal swab, presence of viral mutation(s) within the areas targeted by this assay, and inadequate number of viral copies(<138 copies/mL). A negative result must be combined with clinical observations, patient history, and epidemiological information. The expected result is Negative.  Fact Sheet for Patients:  BloggerCourse.com  Fact Sheet for Healthcare Providers:  SeriousBroker.it  This test is no t yet approved or cleared by the [place] FDA and  has been authorized for detection and/or diagnosis of SARS-CoV-2 by FDA under an  Emergency Use Authorization (EUA). This EUA will remain  in effect (meaning this test can be used) for the duration of the COVID-19 declaration under Section 564(b)(1) of the Act, 21 U.S.C.section 360bbb-3(b)(1), unless the authorization is terminated  or revoked sooner.       Influenza A by PCR NEGATIVE NEGATIVE Final   Influenza B by PCR NEGATIVE NEGATIVE Final    Comment: (NOTE) The Xpert Xpress SARS-CoV-2/FLU/RSV plus assay is intended as an aid in the diagnosis of influenza from Nasopharyngeal swab specimens and should not be used as a sole basis for treatment. Nasal washings and aspirates are unacceptable for Xpert Xpress SARS-CoV-2/FLU/RSV testing.  Fact Sheet for Patients: BloggerCourse.com  Fact Sheet for Healthcare Providers: SeriousBroker.it  This test is not yet approved or cleared by the [place] FDA and has been authorized for detection and/or diagnosis of SARS-CoV-2 by FDA under an Emergency Use Authorization (EUA). This EUA will remain in effect (meaning this test can be used) for the duration of the COVID-19 declaration under Section 564(b)(1) of the Act, 21 U.S.C. section 360bbb-3(b)(1), unless the authorization is terminated or revoked.  Performed at [facility], [address] [place], [place] [address]   Culture, blood (routine x 2)     Status: None (Preliminary result)   Collection Time: 03/19/21  6:01 AM   Specimen: BLOOD LEFT WRIST  Result Value Ref Range Status   Specimen Description BLOOD LEFT WRIST  Final   Special Requests   Final    BOTTLES DRAWN AEROBIC AND ANAEROBIC Blood Culture  adequate volume   Culture   Final    NO GROWTH 2 DAYS Performed at [facility], [address] [place], [place] [address]    Report Status PENDING  Incomplete  Culture, blood (routine x 2)     Status: None (Preliminary result)   Collection Time: 03/19/21  6:06 AM   Specimen: BLOOD  Result  Value Ref Range Status   Specimen Description BLOOD RIGHT ANTECUBITAL  Final   Special Requests   Final    BOTTLES DRAWN AEROBIC AND ANAEROBIC Blood Culture results may not be optimal due to an inadequate volume of blood received in culture bottles   Culture   Final    NO GROWTH 2 DAYS Performed at [facility], [address], [place], [place] [address]    Report Status PENDING  Incomplete     Labs: BNP (last 3 results) No results for input(s): BNP in the last 8760 hours. Basic Metabolic Panel: Recent Labs  Lab 03/18/21 2322 03/19/21 0630 03/21/21 0301  NA 136 134* 138  K 3.9 4.0 3.5  CL 106 105 106  CO2 14* 17* 24  GLUCOSE 90 99 94  BUN 18 17 10   CREATININE 1.03 1.04 0.77  CALCIUM 9.2 9.2 8.8*  MG  --  2.0  --   PHOS  --  5.0*  --    Liver Function Tests: Recent Labs  Lab 03/18/21 2322  AST 59*  ALT 33  ALKPHOS 47  BILITOT 1.8*  PROT 7.6  ALBUMIN 4.5   Recent Labs  Lab 03/18/21 2322  LIPASE 39   No results for input(s): AMMONIA in the last 168 hours. CBC: Recent Labs  Lab 03/18/21 2322 03/19/21 0630 03/20/21 0655 03/21/21 0301  WBC 17.0* 18.4* 7.2 7.7  NEUTROABS 14.8*  --   --   --   HGB 16.1 15.2 13.8 14.0  HCT 46.3 44.8 39.1 39.3  MCV 88.9 90.3 88.3 88.5  PLT 250 265 175 172   Cardiac Enzymes: No results for input(s): CKTOTAL, CKMB, CKMBINDEX, TROPONINI in the last 168 hours. BNP: Invalid input(s): POCBNP CBG: No results for input(s): GLUCAP in the last 168 hours. D-Dimer No results for input(s): DDIMER in the last 72 hours. Hgb A1c Recent Labs    03/21/21 0301  HGBA1C 5.6   Lipid Profile Recent Labs    03/21/21 0301  CHOL 118  HDL 50  LDLCALC 54  TRIG 69  CHOLHDL 2.4   Thyroid function studies No results for input(s): TSH, T4TOTAL, T3FREE, THYROIDAB in the last 72 hours.  Invalid input(s): FREET3 Anemia work up No results for input(s): VITAMINB12, FOLATE, FERRITIN, TIBC, IRON, RETICCTPCT in the last 72  hours. Urinalysis    Component Value Date/Time   COLORURINE YELLOW 03/19/2021 0635   APPEARANCEUR CLEAR 03/19/2021 0635   LABSPEC >1.046 (H) 03/19/2021 0635   PHURINE 5.0 03/19/2021 0635   GLUCOSEU NEGATIVE 03/19/2021 0635   HGBUR NEGATIVE 03/19/2021 0635   BILIRUBINUR NEGATIVE 03/19/2021 0635   KETONESUR 80 (A) 03/19/2021 0635   PROTEINUR NEGATIVE 03/19/2021 0635   UROBILINOGEN 1.0 05/14/2014 1723   NITRITE NEGATIVE 03/19/2021 0635   LEUKOCYTESUR NEGATIVE 03/19/2021 0635   Sepsis Labs Invalid input(s): PROCALCITONIN,  WBC,  LACTICIDVEN Microbiology Recent Results (from the past 240 hour(s))  Resp Panel by RT-PCR (Flu A&B, Covid) Nasopharyngeal Swab     Status: None   Collection Time: 03/18/21 11:32 PM   Specimen: Nasopharyngeal Swab; Nasopharyngeal(NP) swabs in vial transport medium  Result  Value Ref Range Status   SARS Coronavirus 2 by RT PCR NEGATIVE NEGATIVE Final    Comment: (NOTE) SARS-CoV-2 target nucleic acids are NOT DETECTED.  The SARS-CoV-2 RNA is generally detectable in upper respiratory specimens during the acute phase of infection. The lowest concentration of SARS-CoV-2 viral copies this assay can detect is 138 copies/mL. A negative result does not preclude SARS-Cov-2 infection and should not be used as the sole basis for treatment or other patient management decisions. A negative result may occur with  improper specimen collection/handling, submission of specimen other than nasopharyngeal swab, presence of viral mutation(s) within the areas targeted by this assay, and inadequate number of viral copies(<138 copies/mL). A negative result must be combined with clinical observations, patient history, and epidemiological information. The expected result is Negative.  Fact Sheet for Patients:  BloggerCourse.com  Fact Sheet for Healthcare Providers:  SeriousBroker.it  This test is no t yet approved or cleared by  the [place] FDA and  has been authorized for detection and/or diagnosis of SARS-CoV-2 by FDA under an Emergency Use Authorization (EUA). This EUA will remain  in effect (meaning this test can be used) for the duration of the COVID-19 declaration under Section 564(b)(1) of the Act, 21 U.S.C.section 360bbb-3(b)(1), unless the authorization is terminated  or revoked sooner.       Influenza A by PCR NEGATIVE NEGATIVE Final   Influenza B by PCR NEGATIVE NEGATIVE Final    Comment: (NOTE) The Xpert Xpress SARS-CoV-2/FLU/RSV plus assay is intended as an aid in the diagnosis of influenza from Nasopharyngeal swab specimens and should not be used as a sole basis for treatment. Nasal washings and aspirates are unacceptable for Xpert Xpress SARS-CoV-2/FLU/RSV testing.  Fact Sheet for Patients: BloggerCourse.com  Fact Sheet for Healthcare Providers: SeriousBroker.it  This test is not yet approved or cleared by the [place] FDA and has been authorized for detection and/or diagnosis of SARS-CoV-2 by FDA under an Emergency Use Authorization (EUA). This EUA will remain in effect (meaning this test can be used) for the duration of the COVID-19 declaration under Section 564(b)(1) of the Act, 21 U.S.C. section 360bbb-3(b)(1), unless the authorization is terminated or revoked.  Performed at [facility], [address] [place], [place] [address]   Culture, blood (routine x 2)     Status: None (Preliminary result)   Collection Time: 03/19/21  6:01 AM   Specimen: BLOOD LEFT WRIST  Result Value Ref Range Status   Specimen Description BLOOD LEFT WRIST  Final   Special Requests   Final    BOTTLES DRAWN AEROBIC AND ANAEROBIC Blood Culture adequate volume   Culture   Final    NO GROWTH 2 DAYS Performed at [facility], [address], [place], [place] [address]    Report Status PENDING  Incomplete  Culture, blood (routine x  2)     Status: None (Preliminary result)   Collection Time: 03/19/21  6:06 AM   Specimen: BLOOD  Result Value Ref Range Status   Specimen Description BLOOD RIGHT ANTECUBITAL  Final   Special Requests   Final    BOTTLES DRAWN AEROBIC AND ANAEROBIC Blood Culture results may not be optimal due to an inadequate volume of blood received in culture bottles   Culture   Final    NO GROWTH 2 DAYS Performed at [facility], [address], [place], [place] [address]    Report Status PENDING  Incomplete     Time coordinating  discharge:  28 minutes  SIGNED:   Dorcas Carrow, MD  [facility] 03/21/2021, 7:10 PM

## 2021-03-21 NOTE — Progress Notes (Addendum)
The patient has been seen in conjunction with Laverda Page, NP. All aspects of care have been considered and discussed. The patient has been personally interviewed, examined, and all clinical data has been reviewed.   Once MRI report is interpreted, can DC. Results will impact f/u, return to work, etc.  AIVR (accelerated idioventricular rhythm) likely related to underlying myocardial injury myocarditis or transient ischemia (spasm?).  No obstructive disease identified.  No primary therapy is needed.   Progress Note  Patient Name: Jorge Ford Date of Encounter: 03/21/2021  [facility] Cardiologist: No primary care provider on file.   Subjective   No chest pain. Planned for cardiac MRI today.   Inpatient Medications    Scheduled Meds: . aspirin EC  81 mg Oral Daily  . atorvastatin  80 mg Oral Daily  . folic acid  1 mg Oral Daily  . heparin injection (subcutaneous)  5,000 Units Subcutaneous Q8H  . metoprolol tartrate  12.5 mg Oral BID  . multivitamin with minerals  1 tablet Oral Daily  . nicotine  21 mg Transdermal Daily  . sodium chloride flush  3 mL Intravenous Q12H  . thiamine  100 mg Oral Daily   Or  . thiamine  100 mg Intravenous Daily   Continuous Infusions: . sodium chloride    . promethazine (PHENERGAN) injection     PRN Meds: sodium chloride, acetaminophen, LORazepam **OR** LORazepam, morphine injection, nitroGLYCERIN, ondansetron (ZOFRAN) IV, promethazine (PHENERGAN) injection, sodium chloride flush   Vital Signs    Vitals:   03/20/21 2026 03/20/21 2125 03/21/21 0025 03/21/21 0510  BP: (!) 127/98 128/85 119/80 114/75  Pulse: 62 66 (!) 54 (!) 58  Resp: 14  14 16   Temp: 98.8 F (37.1 C)  98.8 F (37.1 C) 97.6 F (36.4 C)  TempSrc: Oral  Oral Oral  SpO2: 97%  96% 97%  Weight:    62.5 kg  Height:        Intake/Output Summary (Last 24 hours) at 03/21/2021 0811 Last data filed at 03/20/2021 1849 Gross per 24 hour  Intake 247.53 ml  Output -   Net 247.53 ml   Last 3 Weights 03/21/2021 03/20/2021 03/19/2021  Weight (lbs) 137 lb 11.2 oz 142 lb 10.2 oz 142 lb 3.2 oz  Weight (kg) 62.46 kg 64.7 kg 64.501 kg      Telemetry    NSR - Personally Reviewed  ECG    No new tracing this morning  Physical Exam   GEN: No acute distress.   Neck: No JVD Cardiac: RRR, no murmurs, rubs, or gallops.  Respiratory: Clear to auscultation bilaterally. GI: Soft, nontender, non-distended  MS: No edema; No deformity. Right radial cath site stable Neuro:  Nonfocal  Psych: Normal affect   Labs    High Sensitivity Troponin:   Recent Labs  Lab 03/18/21 2322 03/19/21 0311 03/19/21 0630 03/19/21 1013  TROPONINIHS 3,231* 6,788* 6,729* 7,470*      Chemistry Recent Labs  Lab 03/18/21 2322 03/19/21 0630 03/21/21 0301  NA 136 134* 138  K 3.9 4.0 3.5  CL 106 105 106  CO2 14* 17* 24  GLUCOSE 90 99 94  BUN 18 17 10   CREATININE 1.03 1.04 0.77  CALCIUM 9.2 9.2 8.8*  PROT 7.6  --   --   ALBUMIN 4.5  --   --   AST 59*  --   --   ALT 33  --   --   ALKPHOS 47  --   --  BILITOT 1.8*  --   --   GFRNONAA >60 >60 >60  ANIONGAP 16* 12 8     Hematology Recent Labs  Lab 03/19/21 0630 03/20/21 0655 03/21/21 0301  WBC 18.4* 7.2 7.7  RBC 4.96 4.43 4.44  HGB 15.2 13.8 14.0  HCT 44.8 39.1 39.3  MCV 90.3 88.3 88.5  MCH 30.6 31.2 31.5  MCHC 33.9 35.3 35.6  RDW 13.3 13.1 12.9  PLT 265 175 172    BNPNo results for input(s): BNP, PROBNP in the last 168 hours.   DDimer No results for input(s): DDIMER in the last 168 hours.   Radiology    CARDIAC CATHETERIZATION  Result Date: 03/20/2021 Jorge Kaufmanhomas Ford is a [age] y.o. male  [id number] LOCATION:  FACILITY: [facility] PHYSICIAN: Nanetta BattyJonathan Berry, M.D. 01/31/1981 DATE OF PROCEDURE:  03/20/2021 DATE OF DISCHARGE: CARDIAC CATHETERIZATION History obtained from chart review.40 y.o. male with PMH of alcohol abuse who presented [facility] with chest pain and found to have a NSTEMI.  His troponins rose  to 7000 level.  His 2D echo was normal.  He said no recurrent chest pain.  He presents now for diagnostic coronary angiography to rule out an ischemic etiology.   Jorge Ford and an LVEDP of 1 suggesting that he is "dry".  There is no "culprit lesion" to explain his non-STEMI.  He may have had coronary vasospasm.  The sheath was removed and a TR band was placed on the right wrist to achieve patent hemostasis.  The patient left lab in stable condition. Nanetta BattyJonathan Berry. MD, [facility] 03/20/2021 3:17 PM   ECHOCARDIOGRAM COMPLETE  Result Date: 03/19/2021    ECHOCARDIOGRAM REPORT   Patient Name:   Jorge Ford Date of Exam: 03/19/2021 Medical Rec #:  [id number]      Height:       72.0 in Accession #:    [id number]     Weight:       140.0 lb Date of Birth:  11/14/1981      BSA:          1.831 m Patient Age:    39 years       BP:           124/82 mmHg Patient Gender: M              HR:           92 bpm. Exam Location:  Inpatient Procedure: 2D Echo, Cardiac Doppler and Color Doppler Indications:    R07.9* Chest pain, unspecified  History:        Patient has no prior history of Echocardiogram examinations.                 Abnormal ECG; Risk Factors:Current Smoker and Family History of                 Coronary Artery Disease. ETOH abuse. Nausea and vomitting.  Sonographer:    Roosvelt Maserachel Lane RDCS Referring Phys: [id number] VASUNDHRA RATHORE IMPRESSIONS  1. Left ventricular ejection fraction, by estimation, is 60 to 65%. The left ventricle has normal function. The left ventricle has no regional wall motion abnormalities. Left ventricular diastolic parameters were normal.  2. Right ventricular systolic function is normal. The right ventricular size is normal.  3. The mitral valve is normal in structure. No evidence of mitral valve regurgitation. No evidence of mitral stenosis.  4. The aortic valve is normal in structure. Aortic valve regurgitation is not visualized. No aortic stenosis  is present.  5. The  inferior vena cava is normal in size with greater than 50% respiratory variability, suggesting right atrial pressure of 3 mmHg. Conclusion(s)/Recommendation(s): Normal biventricular function without evidence of hemodynamically significant valvular heart disease. FINDINGS  Left Ventricle: Left ventricular ejection fraction, by estimation, is 60 to 65%. The left ventricle has normal function. The left ventricle has no regional wall motion abnormalities. The left ventricular internal cavity size was normal in size. There is  no left ventricular hypertrophy. Left ventricular diastolic parameters were normal. Right Ventricle: The right ventricular size is normal. No increase in right ventricular wall thickness. Right ventricular systolic function is normal. Left Atrium: Left atrial size was normal in size. Right Atrium: Right atrial size was normal in size. Pericardium: There is no evidence of pericardial effusion. Mitral Valve: The mitral valve is normal in structure. No evidence of mitral valve regurgitation. No evidence of mitral valve stenosis. Tricuspid Valve: The tricuspid valve is normal in structure. Tricuspid valve regurgitation is not demonstrated. No evidence of tricuspid stenosis. Aortic Valve: The aortic valve is normal in structure. Aortic valve regurgitation is not visualized. No aortic stenosis is present. Aortic valve mean gradient measures 5.0 mmHg. Aortic valve peak gradient measures 8.4 mmHg. Aortic valve area, by VTI measures 1.84 cm. Pulmonic Valve: The pulmonic valve was normal in structure. Pulmonic valve regurgitation is not visualized. No evidence of pulmonic stenosis. Aorta: The aortic root is normal in size and structure. Venous: The inferior vena cava is normal in size with greater than 50% respiratory variability, suggesting right atrial pressure of 3 mmHg. IAS/Shunts: No atrial level shunt detected by color flow Doppler.  LEFT VENTRICLE PLAX 2D LVIDd:         4.00 cm  Diastology LVIDs:          3.10 cm  LV e' medial:    13.80 cm/s LV PW:         1.00 cm  LV E/e' medial:  8.5 LV IVS:        0.90 cm  LV e' lateral:   17.40 cm/s LVOT diam:     2.00 cm  LV E/e' lateral: 6.7 LV SV:         49 LV SV Index:   27 LVOT Area:     3.14 cm  RIGHT VENTRICLE          IVC RV Basal diam:  2.90 cm  IVC diam: 1.40 cm LEFT ATRIUM           Index       RIGHT ATRIUM           Index LA diam:      3.10 cm 1.69 cm/m  RA Area:     13.40 cm LA Vol (A2C): 30.6 ml 16.71 ml/m RA Volume:   32.50 ml  17.75 ml/m LA Vol (A4C): 68.0 ml 37.14 ml/m  AORTIC VALVE AV Area (Vmax):    2.19 cm AV Area (Vmean):   1.95 cm AV Area (VTI):     1.84 cm AV Vmax:           145.00 cm/s AV Vmean:          100.000 cm/s AV VTI:            0.268 m AV Peak Grad:      8.4 mmHg AV Mean Grad:      5.0 mmHg LVOT Vmax:         101.00 cm/s LVOT Vmean:  62.100 cm/s LVOT VTI:          0.157 m LVOT/AV VTI ratio: 0.59  AORTA Ao Root diam: 3.10 cm MITRAL VALVE MV Area (PHT): 3.42 cm     SHUNTS MV Decel Time: 222 msec     Systemic VTI:  0.16 m MV E velocity: 117.00 cm/s  Systemic Diam: 2.00 cm MV A velocity: 77.60 cm/s MV E/A ratio:  1.51 Tobias Alexander MD Electronically signed by Tobias Alexander MD Signature Date/Time: 03/19/2021/12:24:38 PM    Final     Cardiac Studies   Echo: 03/19/21  IMPRESSIONS   1. Left ventricular ejection fraction, by estimation, is 60 to 65%. The  left ventricle has normal function. The left ventricle has no regional  wall motion abnormalities. Left ventricular diastolic parameters were  normal.  2. Right ventricular systolic function is normal. The right ventricular  size is normal.  3. The mitral valve is normal in structure. No evidence of mitral valve  regurgitation. No evidence of mitral stenosis.  4. The aortic valve is normal in structure. Aortic valve regurgitation is  not visualized. No aortic stenosis is present.  5. The inferior vena cava is normal in size with greater than 50%  respiratory  variability, suggesting right atrial pressure of 3 mmHg.   Conclusion(s)/Recommendation(s): Normal biventricular function without  evidence of hemodynamically significant valvular heart disease.    Cath: 03/20/21  IMPRESSION: Mr. Parkerson has normal coronary Ford and an LVEDP of 1 suggesting that he is "dry".  There is no "culprit lesion" to explain his non-STEMI.  He may have had coronary vasospasm.  The sheath was removed and a TR band was placed on the right wrist to achieve patent hemostasis.  The patient left lab in stable condition.  Nanetta Batty. MD, [facility] 03/20/2021 3:17 PM   Patient Profile     40 y.o. male with PMH of alcohol abuse who presented to [facility] with chest pain and found to have a NSTEMI.   Assessment & Plan    1. NSTEMI with complex tachycardia: hsTn peaked at 7470. EKG on admission showed WCT with IVCD and repolarization abnormalities with follow up EKG showing SR with biatrial enlargement and poor R wave progression. CT angio chest/abd/pelvis was negative for acute process. With elevated cardiac markers and initial abnormal EKG suspect he underwent cardiac cath without coronary disease. Echo with normal EF and no rWMA.  -- planned for cardiac MRI to exclude myocarditis  -- ASA, statin (will reduce back to 20mg  daily), and BB  2. Epigastric pain with n/v: in the setting of excessive ETOH use. No resolved. -- lipase/LFTs WNL. CT with hepatic steatosis -- management per primary  3. ETOH/Tobacco use: cessation advised  For questions or updates, please contact [facility] Please consult [email or web address] for contact info under        Signed, , NP  03/21/2021, 8:11 AM

## 2021-03-24 LAB — CULTURE, BLOOD (ROUTINE X 2)
Culture: NO GROWTH
Culture: NO GROWTH
Special Requests: ADEQUATE

## 2022-04-14 IMAGING — CT CT ANGIO CHEST-ABD-PELV FOR DISSECTION W/ AND WO/W CM
2 of 7 series · 11 of 46 positions shown, 12 images · non-contrast
Comparison: Chest radiograph 03/18/2021

CLINICAL DATA: Chest pain, elevated troponin, heavy drinking last
night, now with nausea, vomiting and epigastric/chest pain

EXAM:
CT ANGIOGRAPHY CHEST, ABDOMEN AND PELVIS
TECHNIQUE: Non-contrast CT of the chest was initially obtained.

[Series 6: arterial · axial · arterial · 0.64mm/px · z∈[+939,+1471]mm · 8 of 344 slices shown, 9 images]
[im 39/344  soft-tissue]
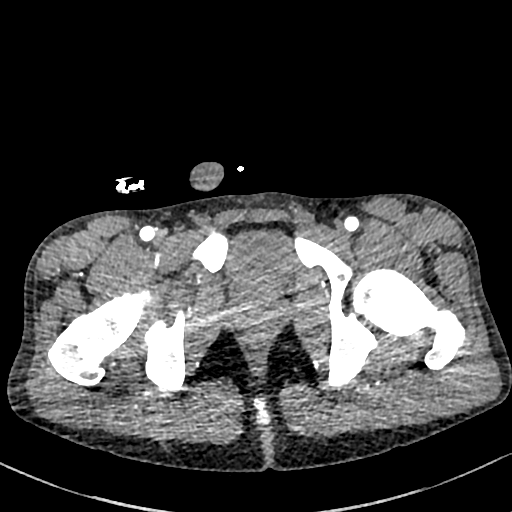
[im 39/344  bone]
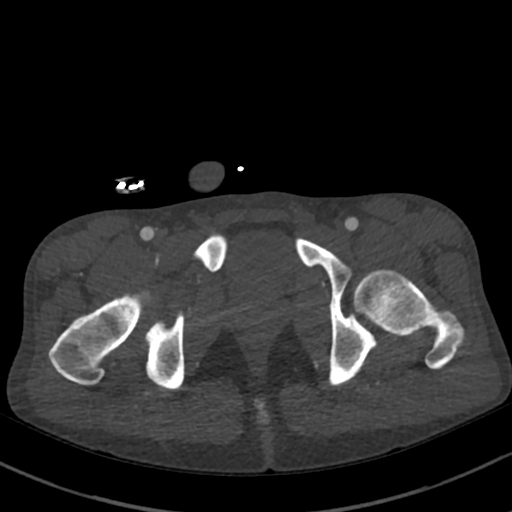
[im 77/344  soft-tissue]
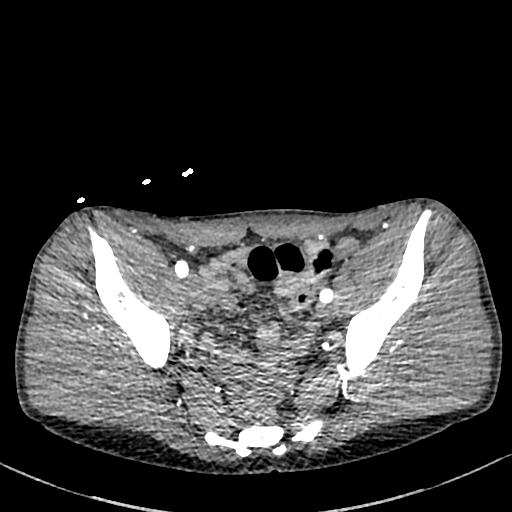
[im 115/344  soft-tissue]
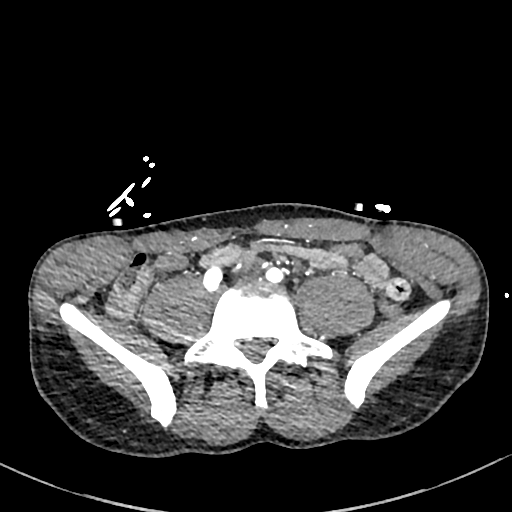
[im 153/344  soft-tissue]
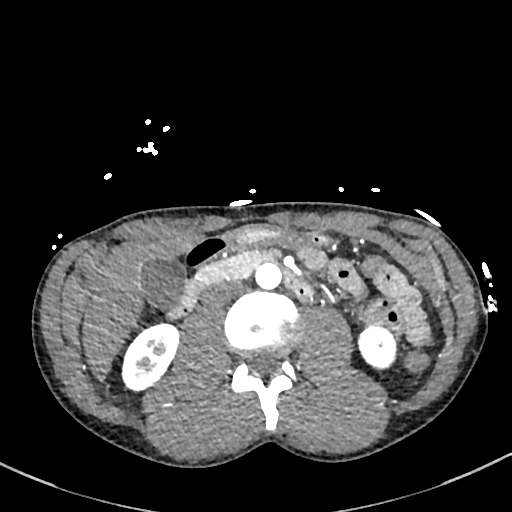
[im 191/344  soft-tissue]
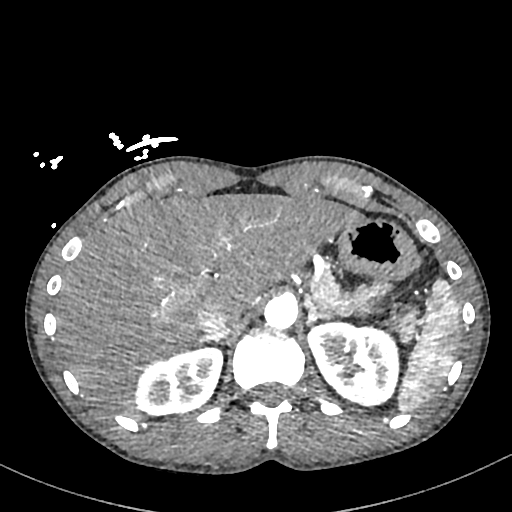
[im 229/344  soft-tissue]
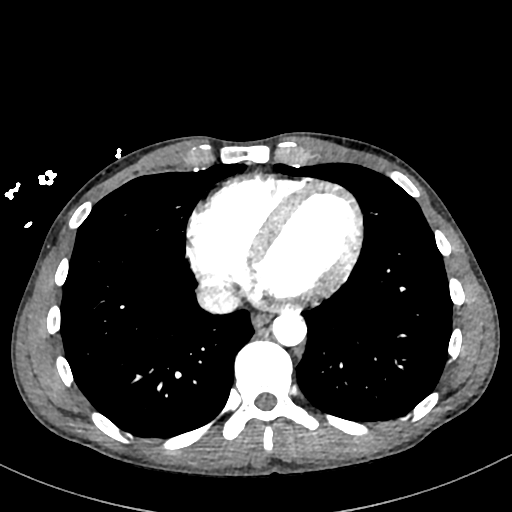
[im 267/344  soft-tissue]
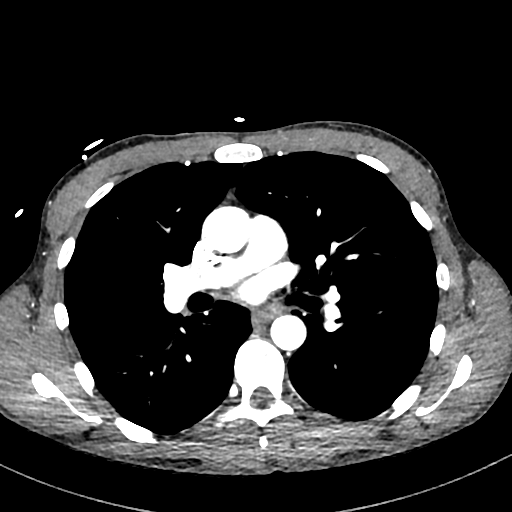
[im 305/344  soft-tissue]
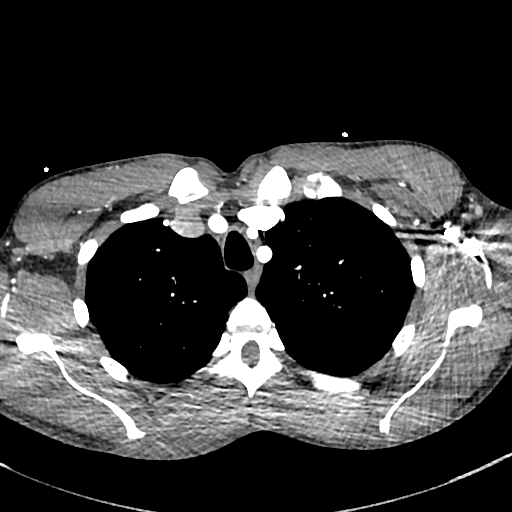

[Series 9: cor · coronal · 0.60mm/px · 3 of 126 slices shown]
[im 32/126  soft-tissue]
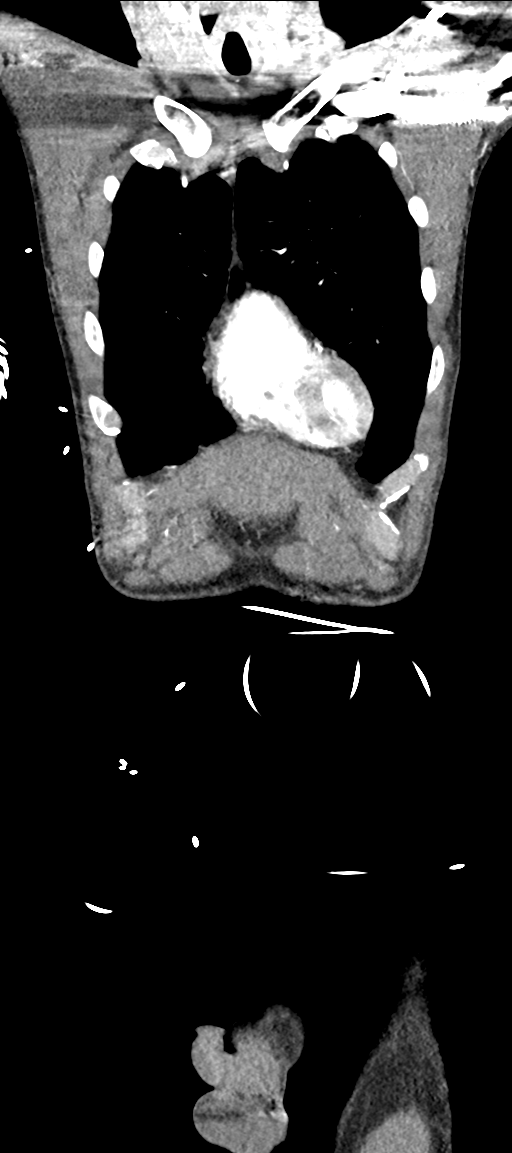
[im 63/126  soft-tissue]
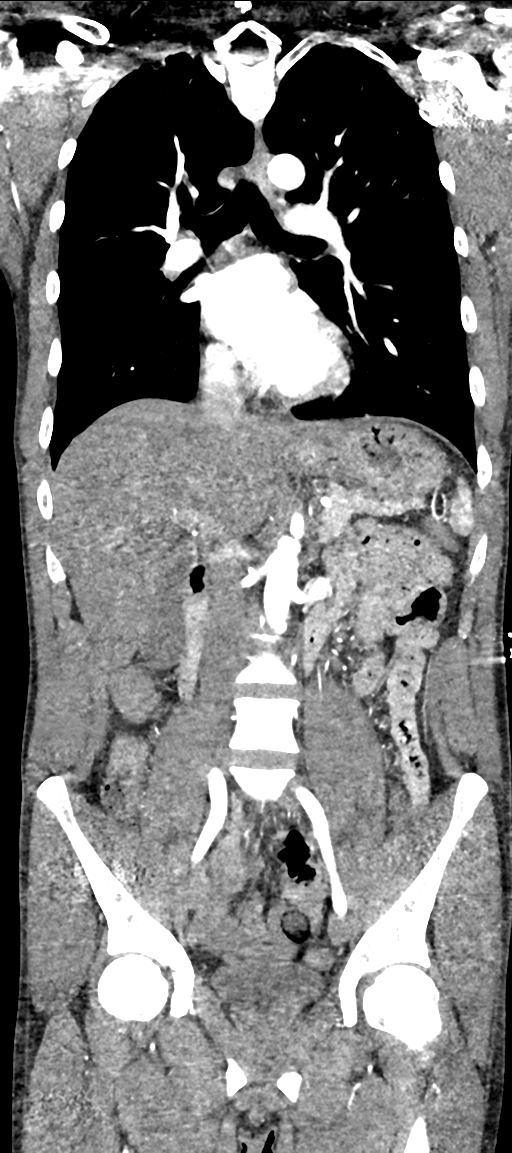
[im 94/126  soft-tissue]
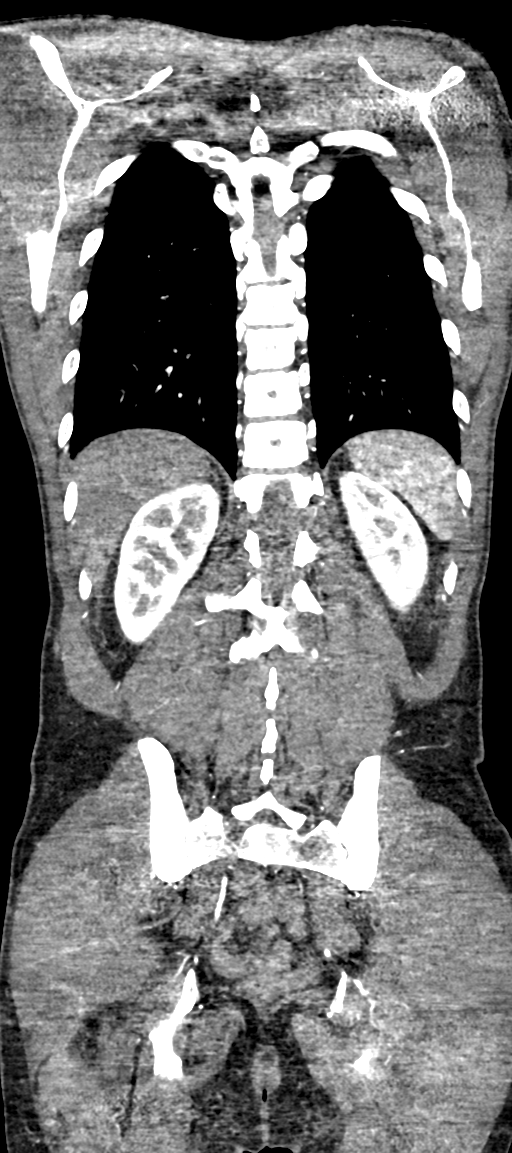

[11 of 46 positions shown; findings below may reference images not displayed]

Multidetector CT imaging through the chest, abdomen and pelvis was
performed using the standard protocol during bolus administration of
intravenous contrast. Multiplanar reconstructed images and MIPs were
obtained and reviewed to evaluate the vascular anatomy.

CONTRAST:  100mL OMNIPAQUE IOHEXOL 350 MG/ML SOLN
FINDINGS: CTA CHEST FINDINGS

Cardiovascular: A noncontrast CT of the chest was performed
initially. No hyperdense mural thickening to suggest intramural
hematoma. Postcontrast administration there is satisfactory
preferential opacification of the thoracic aorta. Mild motion
artifact seen at the level of the aortic root, ascending aorta and
proximal descending arch. Accounting for motion artifact, no acute
luminal abnormality is seen. No periaortic stranding or hemorrhage.
Normal 3 vessel branching of the aortic arch. Proximal great vessels
are unremarkable. Central pulmonary arteries are normal caliber. No
large central or lobar filling defects within limitations non
tailored examination of the pulmonary arteries. Normal heart size.
No pericardial effusion.

Mediastinum/Nodes: No mediastinal fluid or gas. Normal thyroid gland
and thoracic inlet. No acute abnormality of the trachea or
esophagus. No worrisome mediastinal, hilar or axillary adenopathy.

Lungs/Pleura: No consolidation, features of edema, pneumothorax, or
effusion. Mild hyperinflation. Tiny benign calcified granuloma seen
in the anterior right upper lobe (7/69). No suspicious pulmonary
nodules or masses.

Musculoskeletal: 0 mild discogenic changes and multilevel Schmorl's
node formations present in the spine. Degenerative changes most
pronounced at the T11-12 disc space and in the lower cervical
levels. Mild arthrosis in the bilateral shoulders as well. No acute
or worrisome chest wall abnormalities.

Review of the MIP images confirms the above findings.

CTA ABDOMEN AND PELVIS FINDINGS

VASCULAR

Aorta: Normal caliber aorta without aneurysm, dissection, vasculitis
or significant stenosis.

Celiac: Patent without evidence of aneurysm, dissection, vasculitis
or significant stenosis.

SMA: Patent without evidence of aneurysm, dissection, vasculitis or
significant stenosis.

Renals: Single renal arteries bilaterally. Both renal arteries are
patent without evidence of aneurysm, dissection, vasculitis,
fibromuscular dysplasia or significant stenosis.

IMA: Patent without acute luminal abnormality, aneurysm or visible
dissection.

Inflow: Inflow vessels and proximal outflow vasculature is widely
patent bilaterally. No evidence of aneurysm, dissection or
vasculitis.

Veins: No obvious venous abnormality within the limitations of this
arterial phase study.

Review of the MIP images confirms the above findings.

NON-VASCULAR

Hepatobiliary: While some of the hypoattenuation of liver parenchyma
may be related to arterial phase imaging, sparing along the
gallbladder fossa does suggest some degree of hepatic steatosis.
Smooth liver surface contour. No concerning liver lesion. Normal
gallbladder and biliary tree without visible calcified gallstone.

Pancreas: No pancreatic ductal dilatation or surrounding
inflammatory changes.

Spleen: Heterogeneous splenic enhancement is typical for the
arterial phase of imaging. Normal in size. No concerning splenic
lesions.

Adrenals/Urinary Tract: Normal adrenal glands. Kidneys are normally
located with symmetric enhancement. No suspicious renal lesion,
urolithiasis or hydronephrosis. Urinary bladder is largely
decompressed at the time of exam and therefore poorly evaluated by
CT imaging. Mild bladder wall thickening may be related to
underdistention. No other gross bladder abnormality.

Stomach/Bowel: Distal esophagus, stomach and duodenum are
unremarkable. No small bowel thickening or dilatation. No evidence
of obstruction. Normal appendix in the right lower quadrant. Colon
largely decompressed at the time of exam. No significant colonic
thickening or dilatation accounting for underdistention. Limited
assessment of the mesentery given a paucity of intraperitoneal fat.

Lymphatic: No suspicious or enlarged lymph nodes in the included
lymphatic chains.

Reproductive: The prostate and seminal vesicles are unremarkable. No
acute abnormality of the included external genitalia.

Other: No abdominopelvic free fluid or free gas. No bowel containing
hernias.

Musculoskeletal: No acute osseous abnormality or suspicious osseous
lesion. Minimal discogenic changes.

Review of the MIP images confirms the above findings.
IMPRESSION: 1. No evidence of acute aortic syndrome.
2. Mild circumferential bladder wall thickening, possibly related to
underdistention. Correlate with urinalysis to exclude cystitis.
3. No other acute intrathoracic or intra-abdominal process.
4. Hepatic steatosis.
